# Patient Record
Sex: Male | Born: 1960 | Hispanic: No | Marital: Single | Smoking: Former smoker
Health system: Southern US, Community
[De-identification: ages and names within clinical notes are randomized; demographics above are authoritative.]

## PROBLEM LIST (undated history)

## (undated) DIAGNOSIS — G4733 Obstructive sleep apnea (adult) (pediatric): Secondary | ICD-10-CM

## (undated) DIAGNOSIS — I219 Acute myocardial infarction, unspecified: Secondary | ICD-10-CM

## (undated) DIAGNOSIS — I10 Essential (primary) hypertension: Secondary | ICD-10-CM

## (undated) DIAGNOSIS — C140 Malignant neoplasm of pharynx, unspecified: Secondary | ICD-10-CM

## (undated) DIAGNOSIS — I2510 Atherosclerotic heart disease of native coronary artery without angina pectoris: Secondary | ICD-10-CM

## (undated) DIAGNOSIS — J449 Chronic obstructive pulmonary disease, unspecified: Secondary | ICD-10-CM

## (undated) DIAGNOSIS — N200 Calculus of kidney: Secondary | ICD-10-CM

## (undated) DIAGNOSIS — E785 Hyperlipidemia, unspecified: Secondary | ICD-10-CM

## (undated) HISTORY — DX: Atherosclerotic heart disease of native coronary artery without angina pectoris: I25.10

## (undated) HISTORY — DX: Obstructive sleep apnea (adult) (pediatric): G47.33

## (undated) HISTORY — PX: CORONARY STENT PLACEMENT: SHX1402

## (undated) HISTORY — DX: Hyperlipidemia, unspecified: E78.5

## (undated) HISTORY — DX: Calculus of kidney: N20.0

## (undated) HISTORY — DX: Essential (primary) hypertension: I10

## (undated) HISTORY — DX: Malignant neoplasm of pharynx, unspecified: C14.0

## (undated) HISTORY — PX: THROAT SURGERY: SHX803

---

## 2009-03-11 ENCOUNTER — Inpatient Hospital Stay (HOSPITAL_COMMUNITY): Admission: EM | Admit: 2009-03-11 | Discharge: 2009-03-14 | Payer: Self-pay | Admitting: Cardiology

## 2009-03-11 ENCOUNTER — Encounter: Payer: Self-pay | Admitting: Cardiology

## 2009-03-11 ENCOUNTER — Ambulatory Visit: Payer: Self-pay | Admitting: Cardiology

## 2009-03-12 ENCOUNTER — Encounter: Payer: Self-pay | Admitting: Cardiology

## 2009-03-12 ENCOUNTER — Encounter: Payer: Self-pay | Admitting: Cardiovascular Disease

## 2009-03-13 ENCOUNTER — Encounter: Payer: Self-pay | Admitting: Cardiology

## 2009-03-31 DIAGNOSIS — I214 Non-ST elevation (NSTEMI) myocardial infarction: Secondary | ICD-10-CM | POA: Insufficient documentation

## 2009-03-31 DIAGNOSIS — R079 Chest pain, unspecified: Secondary | ICD-10-CM | POA: Insufficient documentation

## 2009-03-31 DIAGNOSIS — F17200 Nicotine dependence, unspecified, uncomplicated: Secondary | ICD-10-CM | POA: Insufficient documentation

## 2009-04-01 ENCOUNTER — Ambulatory Visit: Payer: Self-pay | Admitting: Cardiology

## 2009-04-01 DIAGNOSIS — E785 Hyperlipidemia, unspecified: Secondary | ICD-10-CM

## 2009-04-01 DIAGNOSIS — I2510 Atherosclerotic heart disease of native coronary artery without angina pectoris: Secondary | ICD-10-CM | POA: Insufficient documentation

## 2009-04-01 DIAGNOSIS — I10 Essential (primary) hypertension: Secondary | ICD-10-CM

## 2009-04-01 DIAGNOSIS — F1410 Cocaine abuse, uncomplicated: Secondary | ICD-10-CM

## 2009-04-15 ENCOUNTER — Encounter: Payer: Self-pay | Admitting: Cardiology

## 2009-04-17 ENCOUNTER — Telehealth (INDEPENDENT_AMBULATORY_CARE_PROVIDER_SITE_OTHER): Payer: Self-pay | Admitting: *Deleted

## 2009-04-18 ENCOUNTER — Telehealth (INDEPENDENT_AMBULATORY_CARE_PROVIDER_SITE_OTHER): Payer: Self-pay | Admitting: *Deleted

## 2009-05-16 ENCOUNTER — Encounter (INDEPENDENT_AMBULATORY_CARE_PROVIDER_SITE_OTHER): Payer: Self-pay | Admitting: *Deleted

## 2009-06-09 ENCOUNTER — Telehealth (INDEPENDENT_AMBULATORY_CARE_PROVIDER_SITE_OTHER): Payer: Self-pay | Admitting: *Deleted

## 2009-07-15 ENCOUNTER — Encounter (INDEPENDENT_AMBULATORY_CARE_PROVIDER_SITE_OTHER): Payer: Self-pay | Admitting: *Deleted

## 2010-03-31 NOTE — Progress Notes (Signed)
Summary: PHONE  Phone Note Call from Patient Call back at Home Phone [phone]   Caller: RENEE   SISTER Summary of Call: Called again wanting to know if we have heard anything about patient getting coupons or help with his medications.  please call [phone] Initial call taken by: Zachary George,  April 18, 2009 1:50 PM  Follow-up for Phone Call        see previous note for response. Follow-up by: Carlye Grippe,  April 18, 2009 2:45 PM

## 2010-03-31 NOTE — Letter (Signed)
Summary: Generic Engineer, agricultural at [facility] [address]   [place], [place] [address]   Phone: [phone]  Fax: [phone]        May 16, 2009 MRN: [id number]    Ronald Best [address] [place], [place]  [address]    Dear [name],  We have attempted to reach you several times regarding your recent lab results but have been unable to get in touch with you. Please, contact our office at your earliest convenience to receive these results.       Sincerely,  Cyril Loosen, RN, BSN  This letter has been electronically signed by your physician.

## 2010-03-31 NOTE — Cardiovascular Report (Signed)
Summary: Cardiac Cath Other  Cardiac Cath Other   Imported By: Zachary George 03/31/2009 13:51:28  _____________________________________________________________________  External Attachment:    Type:   Image     Comment:   External Document

## 2010-03-31 NOTE — Progress Notes (Signed)
Summary: REQUEST EFFIENT  Phone Note Call from Patient Call back at [phone]   Caller: Renae Siple sister in law Call For: nurse Summary of Call: request coupons for effient. Are these available here?  Initial call taken by: Carlye Grippe,  April 17, 2009 3:59 PM  Follow-up for Phone Call        called and informed Renae that she can go online and access coupons for medication. Follow-up by: Carlye Grippe,  April 18, 2009 2:45 PM

## 2010-03-31 NOTE — Letter (Signed)
Summary: Generic Engineer, agricultural at [facility] [address]   [place], [place] [address]   Phone: [phone]  Fax: [phone]        Jul 15, 2009 MRN: [id number]    Ronald Best [address] [place], [place]  [address]    Dear Mr. RAMNAUTH,   It appears you are now due for lab work to check your cholesterol and liver function following changes made to your medications in March and April. Please take the enclosed order to the [facility] at your earliest convenience to have this lab work drawn. Do not eat or drink after midnight.      Sincerely,  Cyril Loosen, RN, BSN  This letter has been electronically signed by your physician.

## 2010-03-31 NOTE — Assessment & Plan Note (Signed)
Summary: new pt.hospital d/c [facility] 1/14   Visit Type:  Follow-up Primary Provider:  DR.Vyas   History of Present Illness: 50 year old male admitted to [facility] in January of 2011 following a non-ST elevation myocardial infarction. The patient did have cocaine in his system. He underwent cardiac catheterization on January 12 of 2011. This revealed A. normal left main. The LAD had a 30% mid stenosis.  The distal LAD became small in caliber and had diffuse 40% stenosis.  The second obtuse marginal was a moderate-size vessel that had a diffuse area of 50% stenosis in the midportion.  The ostial circumflex vessel appeared to have a 20-30% stenosis. The right coronary artery had  100% occlusion in the midportion of the vessel.  The distal vessel was seen to fill via left-to-right collaterals. Ejection fraction of 35-40% with severe hypokinesis of the inferior wall. An echocardiogram on March 12, 2009 showed normal LV function. Note the patient did have a drug-eluting stent to the right coronary artery. Since discharge the patient denies any dyspnea on exertion, orthopnea, PND, pedal edema, palpitations, syncope or chest pain.    Current Medications (verified): 1)  Simvastatin 40 Mg Tabs (Simvastatin) .... Take 1 Tab Daily 2)  Effient 10 Mg Tabs (Prasugrel Hcl) .... Take 1 Tab Daily 3)  Plavix 75 Mg Tabs (Clopidogrel Bisulfate) .... Take 1 Tab Daily 4)  Nitrostat 0.4 Mg Subl (Nitroglycerin) 5)  Aspir-Low 81 Mg Tbec (Aspirin) .... Take 2 Tabs At Bedtime  Allergies (verified): No Known Drug Allergies  Past History:  Past Medical History: Throat CA Kidney Stone Hypertension, currently not on mediciation coronary artery disease  Family History: Reviewed history from 03/31/2009 and no changes required. Father had CABG recently age 9 Mother has diabetes Sister died at age 46 MI  Social History: Reviewed history from 03/31/2009 and no changes required. Smokes cigarettes greater  than 1 pack per day for many yrs He occasionally drinks alcohol Cocaine use was Jan. 7, 2011 Single Has one daughter  Review of Systems       Some arthralgias in the left knee but no fevers or chills, productive cough, hemoptysis, dysphasia, odynophagia, melena, hematochezia, dysuria, hematuria, rash, seizure activity, orthopnea, PND, pedal edema, claudication. Remaining systems are negative.   Vital Signs:  Patient profile:   50 year old male Height:      71 inches Weight:      297 pounds BMI:     41.57 Pulse rate:   81 / minute BP sitting:   152 / 87  (right arm)  Vitals Entered By: Dreama Saa, CNA (April 01, 2009 9:13 AM)  Physical Exam  General:  Well-developed obese in no acute distress.  Skin is warm and dry.  HEENT is normal.  Neck is supple. No thyromegaly.  Chest is clear to auscultation with normal expansion.  Cardiovascular exam is regular rate and rhythm.  Abdominal exam nontender or distended. No masses palpated. right groin shows no hematoma and no bruit. Extremities show no edema. neuro grossly intact    EKG  Procedure date:  04/01/2009  Findings:      Sinus rhythm at a rate of 74. Incomplete right bundle branch block. Inferior infarct with T-wave inversion.  Impression & Recommendations:  Problem # 1:  CAD (ICD-414.00) Continue aspirin, effient, and statin. I discussed the importance of diet and exercise. His updated medication list for this problem includes:    Effient 10 Mg Tabs (Prasugrel hcl) .Marland Kitchen... Take 1 tab daily  Nitrostat 0.4 Mg Subl (Nitroglycerin) .Marland Kitchen... Place on tablet under tongue every 5 minutes up to 3 doses as needed for chest pain. no more than 3 doses over a 15 minute period.    Aspir-low 81 Mg Tbec (Aspirin) .Marland Kitchen... Take 2 tabs at bedtime    Amlodipine Besylate 5 Mg Tabs (Amlodipine besylate) .Marland Kitchen... Take one tablet by mouth daily  Problem # 2:  HYPERLIPIDEMIA (ICD-272.4) Continue statin. Check lipids and liver in 2  weeks. His updated medication list for this problem includes:    Simvastatin 40 Mg Tabs (Simvastatin) .Marland Kitchen... Take 1 tab daily  His updated medication list for this problem includes:    Simvastatin 40 Mg Tabs (Simvastatin) .Marland Kitchen... Take 1 tab daily  Problem # 3:  ESSENTIAL HYPERTENSION, BENIGN (ICD-401.1) Blood pressure elevated. Add Norvasc  5 mg p.o. daily. I will avoid beta-blockade at this point given her history of cocaine use. His updated medication list for this problem includes:    Aspir-low 81 Mg Tbec (Aspirin) .Marland Kitchen... Take 2 tabs at bedtime    Amlodipine Besylate 5 Mg Tabs (Amlodipine besylate) .Marland Kitchen... Take one tablet by mouth daily  His updated medication list for this problem includes:    Aspir-low 81 Mg Tbec (Aspirin) .Marland Kitchen... Take 2 tabs at bedtime    Amlodipine Besylate 5 Mg Tabs (Amlodipine besylate) .Marland Kitchen... Take one tablet by mouth daily  Problem # 4:  COCAINE ABUSE (ICD-305.60) Discussed the importance of avoiding.  Problem # 5:  NONDEPENDENT TOBACCO USE DISORDER (ICD-305.1) Patient has discontinued his tobacco use.  Other Orders: Future Orders: T-Hepatic Function [id number]) ... 04/14/2009 T-Lipid Profile [id number]) ... 04/14/2009  Patient Instructions: 1)  Start Norvasc (amlodipine) 5 mg by mouth once daily.  2)  Your physician recommends that you go to the [facility] for a FASTING lipid profile and liver function labs:  in 2 weeks. Do not eat or drink after midnight.  3)  Your physician wants you to follow-up in: 6 months. You will receive a reminder letter in the mail one-two months in advance. If you don't receive a letter, please call our office to schedule the follow-up appointment. Prescriptions: NITROSTAT 0.4 MG SUBL (NITROGLYCERIN) Place on tablet under tongue every 5 minutes up to 3 doses as needed for chest pain. No more than 3 doses over a 15 minute period.  #25 x 3   Entered by:   Cyril Loosen, RN, BSN   Authorized by:   Ferman Hamming, MD,  [facility]   Signed by:   Cyril Loosen, RN, BSN on 04/01/2009   Method used:   Electronically to        [facility] (retail)       [address]       [place], [place]  [address]       Ph: [phone]       Fax: [phone]   RxID:   [id number]   Handout requested. AMLODIPINE BESYLATE 5 MG TABS (AMLODIPINE BESYLATE) Take one tablet by mouth daily  #30 x 6   Entered by:   Cyril Loosen, RN, BSN   Authorized by:   Ferman Hamming, MD, [facility]   Signed by:   Cyril Loosen, RN, BSN on 04/01/2009   Method used:   Electronically to        [facility] (retail)       [address] [employer]  [place], [place]  [address]       Ph: [phone]       Fax: [phone]   RxID:   [id number]   Handout requested.

## 2010-03-31 NOTE — Progress Notes (Signed)
Summary: Cannot afford Crestor  Phone Note Call from Patient Call back at [phone]   Summary of Call: Pt's sister-in-law, Luster Landsberg, called regarding Crestor. Pt's simvastatin was recently stopped and he was started on Crestor. She states she and her husband help her brother-in-law get his medications. She states the Crestor is going to be $142, and they cannot afford this. Is there something else he can take? Initial call taken by: Cyril Loosen, RN, BSN,  June 09, 2009 5:14 PM  Follow-up for Phone Call        He can take zocor 40 mg by mouth daily but should understand he will not get to goal with this. If he changes, check lipids and liver six weeks later Ferman Hamming, MD, [facility]  June 10, 2009 9:53 AM   Additional Follow-up for Phone Call Additional follow up Details #1::        Pt's sister-in-law notified. Will call into [facility] in [place] and recheck labs in 6 weeks. Additional Follow-up by: Cyril Loosen, RN, BSN,  June 10, 2009 10:32 AM    New/Updated Medications: SIMVASTATIN 40 MG TABS (SIMVASTATIN) Take 1 tab by mouth at bedtime Prescriptions: SIMVASTATIN 40 MG TABS (SIMVASTATIN) Take 1 tab by mouth at bedtime  #90 x 3   Entered by:   Cyril Loosen, RN, BSN   Authorized by:   Ferman Hamming, MD, [facility]   Signed by:   Cyril Loosen, RN, BSN on 06/10/2009   Method used:   Electronically to        [facility] [phone]* (retail)       [address]       [place], [place]  [address]       Ph: [phone] or [phone]       Fax: [phone]   RxID:   [id number]

## 2010-05-17 LAB — CBC
Hemoglobin: 13.4 g/dL (ref 13.0–17.0)
MCHC: 34.8 g/dL (ref 30.0–36.0)
MCV: 92.6 fL (ref 78.0–100.0)
Platelets: 153 10*3/uL (ref 150–400)
RBC: 4.01 MIL/uL — ABNORMAL LOW (ref 4.22–5.81)
RBC: 4.19 MIL/uL — ABNORMAL LOW (ref 4.22–5.81)
RDW: 15 % (ref 11.5–15.5)
WBC: 8.7 10*3/uL (ref 4.0–10.5)

## 2010-05-17 LAB — COMPREHENSIVE METABOLIC PANEL
ALT: 27 U/L (ref 0–53)
AST: 98 U/L — ABNORMAL HIGH (ref 0–37)
Alkaline Phosphatase: 63 U/L (ref 39–117)
CO2: 26 mEq/L (ref 19–32)
Calcium: 8.6 mg/dL (ref 8.4–10.5)
GFR calc Af Amer: >60 mL/min (ref >60)
GFR calc non Af Amer: >60 mL/min (ref >60)
Glucose, Bld: 111 mg/dL — ABNORMAL HIGH (ref 70–99)
Potassium: 3.7 mEq/L (ref 3.5–5.1)
Sodium: 139 mEq/L (ref 135–145)

## 2010-05-17 LAB — BASIC METABOLIC PANEL
BUN: 12 mg/dL (ref 6–23)
BUN: 12 mg/dL (ref 6–23)
CO2: 27 mEq/L (ref 19–32)
Calcium: 8.8 mg/dL (ref 8.4–10.5)
Chloride: 103 mEq/L (ref 96–112)
Chloride: 106 mEq/L (ref 96–112)
Creatinine, Ser: 1.02 mg/dL (ref 0.4–1.5)
GFR calc non Af Amer: >60 mL/min (ref >60)
Glucose, Bld: 93 mg/dL (ref 70–99)
Glucose, Bld: 98 mg/dL (ref 70–99)
Potassium: 3.9 mEq/L (ref 3.5–5.1)
Sodium: 137 mEq/L (ref 135–145)

## 2010-05-17 LAB — LIPID PANEL
Cholesterol: 167 mg/dL (ref 0–200)
LDL Cholesterol: 107 mg/dL — ABNORMAL HIGH (ref 0–99)
Triglycerides: 154 mg/dL — ABNORMAL HIGH (ref <150)

## 2010-05-17 LAB — HEMOGLOBIN A1C: Hgb A1c MFr Bld: 5.6 % (ref 4.6–6.1)

## 2010-05-17 LAB — CARDIAC PANEL(CRET KIN+CKTOT+MB+TROPI)
Relative Index: 11 — ABNORMAL HIGH (ref 0.0–2.5)
Total CK: 1176 U/L — ABNORMAL HIGH (ref 7–232)
Total CK: 1259 U/L — ABNORMAL HIGH (ref 7–232)
Troponin I: 39.18 ng/mL (ref 0.00–0.06)
Troponin I: 45.31 ng/mL (ref 0.00–0.06)

## 2010-05-17 LAB — TSH: TSH: 1.268 u[IU]/mL (ref 0.350–4.500)

## 2010-05-17 LAB — HEPARIN LEVEL (UNFRACTIONATED): Heparin Unfractionated: <0.1 IU/mL — ABNORMAL LOW (ref 0.30–0.70)

## 2010-05-17 LAB — PROTIME-INR: INR: 1.06 (ref 0.00–1.49)

## 2013-10-17 ENCOUNTER — Emergency Department (HOSPITAL_COMMUNITY)
Admission: EM | Admit: 2013-10-17 | Discharge: 2013-10-17 | Disposition: A | Discharge status: Home / Self Care | Payer: Self-pay | Attending: Emergency Medicine | Admitting: Emergency Medicine

## 2013-10-17 ENCOUNTER — Encounter (HOSPITAL_COMMUNITY): Payer: Self-pay | Admitting: Emergency Medicine

## 2013-10-17 ENCOUNTER — Emergency Department (HOSPITAL_COMMUNITY): Payer: Self-pay

## 2013-10-17 DIAGNOSIS — F17200 Nicotine dependence, unspecified, uncomplicated: Secondary | ICD-10-CM | POA: Insufficient documentation

## 2013-10-17 DIAGNOSIS — S59919A Unspecified injury of unspecified forearm, initial encounter: Secondary | ICD-10-CM

## 2013-10-17 DIAGNOSIS — S59909A Unspecified injury of unspecified elbow, initial encounter: Secondary | ICD-10-CM | POA: Insufficient documentation

## 2013-10-17 DIAGNOSIS — W06XXXA Fall from bed, initial encounter: Secondary | ICD-10-CM | POA: Insufficient documentation

## 2013-10-17 DIAGNOSIS — I10 Essential (primary) hypertension: Secondary | ICD-10-CM | POA: Insufficient documentation

## 2013-10-17 DIAGNOSIS — S5002XA Contusion of left elbow, initial encounter: Secondary | ICD-10-CM

## 2013-10-17 DIAGNOSIS — I2510 Atherosclerotic heart disease of native coronary artery without angina pectoris: Secondary | ICD-10-CM | POA: Insufficient documentation

## 2013-10-17 DIAGNOSIS — S5000XA Contusion of unspecified elbow, initial encounter: Secondary | ICD-10-CM | POA: Insufficient documentation

## 2013-10-17 DIAGNOSIS — Y9289 Other specified places as the place of occurrence of the external cause: Secondary | ICD-10-CM | POA: Insufficient documentation

## 2013-10-17 DIAGNOSIS — Z87442 Personal history of urinary calculi: Secondary | ICD-10-CM | POA: Insufficient documentation

## 2013-10-17 DIAGNOSIS — Z85819 Personal history of malignant neoplasm of unspecified site of lip, oral cavity, and pharynx: Secondary | ICD-10-CM | POA: Insufficient documentation

## 2013-10-17 DIAGNOSIS — Z791 Long term (current) use of non-steroidal anti-inflammatories (NSAID): Secondary | ICD-10-CM | POA: Insufficient documentation

## 2013-10-17 DIAGNOSIS — S6990XA Unspecified injury of unspecified wrist, hand and finger(s), initial encounter: Secondary | ICD-10-CM

## 2013-10-17 DIAGNOSIS — Y9389 Activity, other specified: Secondary | ICD-10-CM | POA: Insufficient documentation

## 2013-10-17 MED ORDER — HYDROCODONE-ACETAMINOPHEN 5-325 MG PO TABS: ORAL_TABLET | ORAL | Status: DC

## 2013-10-17 MED ORDER — IBUPROFEN 800 MG PO TABS
800.0000 mg | ORAL_TABLET | Freq: Once | ORAL | Status: AC
  Administered 2013-10-17: 800 mg via ORAL
  Filled 2013-10-17: qty 1

## 2013-10-17 MED ORDER — OXYCODONE-ACETAMINOPHEN 5-325 MG PO TABS
1.0000 | ORAL_TABLET | Freq: Once | ORAL | Status: AC
Start: 2013-10-17 — End: 2013-10-17
  Administered 2013-10-17: 1 via ORAL
  Filled 2013-10-17: qty 1

## 2013-10-17 MED ORDER — NAPROXEN 500 MG PO TABS: 500.0000 mg | ORAL_TABLET | Freq: Two times a day (BID) | ORAL | Status: DC

## 2013-10-17 NOTE — ED Notes (Signed)
Left elbow pain since falling out of bed night before last, while attempting to keep grandchild from falling out of bed.

## 2013-10-17 NOTE — Discharge Instructions (Signed)
Elbow Contusion ° An elbow contusion is a deep bruise of the elbow. Contusions happen when an injury causes bleeding under the skin. Signs of bruising include pain, puffiness (swelling), and discolored skin. The contusion may turn blue, purple, or yellow. °HOME CARE °· Put ice on the injured area. °¨ Put ice in a plastic bag. °¨ Place a towel between your skin and the bag. °¨ Leave the ice on for 15-20 minutes, 03-04 times a day. °· Only take medicines as told by your doctor. °· Rest your elbow until the pain and puffiness are better. °· Raise (elevate) your elbow to lessen puffiness. °· Put on an elastic wrap as told by your doctor. You can take it off for sleeping, showers, and baths. If your fingers get cold, blue, or lose feeling (numb), take the wrap off. Put the wrap back on more loosely. °· Use your elbow only as told by your doctor. If you are asked to do elbow exercises, do them as told. °· Keep all doctor visits as told. °GET HELP RIGHT AWAY IF: °· You have more redness, puffiness, or pain in your elbow. °· Your puffiness or pain is not helped by medicines. °· You have puffiness of the hand and fingers. °· You are not able to move your fingers or wrist. °· You start to lose feeling in your hand or fingers. °· Your fingers or hand become cold or blue. °MAKE SURE YOU:  °· Understand these instructions. °· Will watch your condition. °· Will get help right away if you are not doing well or get worse. °Document Released: 02/04/2011 Document Revised: 08/17/2011 Document Reviewed: 02/04/2011 °ExitCare® Patient Information ©2015 [employer]. This information is not intended to replace advice given to you by your health care provider. Make sure you discuss any questions you have with your health care provider. ° °

## 2013-10-20 NOTE — ED Provider Notes (Signed)
CSN: [id number]     Arrival date & time 10/17/13  1214 History   First MD Initiated Contact with Patient 10/17/13 1246     Chief Complaint  Patient presents with  . Elbow Pain   Ronald Best is a 53 y.o. male who presents to the Emergency Department complaining of left posterior elbow pain after a direct blow to the elbow after a fall.  Pt states that he accidentally fell out of bed, landing on his left arm.   reprots pain with full extension of the left arm.  He denies neck pain, numbness or weakness or the arm, neck pain , head injury or LOC.  He has used icy hot w/o relief.    (Consider location/radiation/quality/duration/timing/severity/associated sxs/prior Treatment) The history is provided by the patient.    Past Medical History  Diagnosis Date  . Throat cancer   . Kidney stones   . Hypertension   . Coronary artery disease    Past Surgical History  Procedure Laterality Date  . Throat surgery     Family History  Problem Relation Age of Onset  . Diabetes Mother    History  Substance Use Topics  . Smoking status: Current Every Day Smoker -- 1 years    Types: Cigarettes  . Smokeless tobacco: Not on file  . Alcohol Use: Yes     Comment: Occ    Review of Systems  Constitutional: Negative for fever and chills.  Respiratory: Negative for chest tightness.   Cardiovascular: Negative for chest pain.  Genitourinary: Negative for dysuria and difficulty urinating.  Musculoskeletal: Positive for arthralgias. Negative for back pain, joint swelling, neck pain and neck stiffness.  Skin: Negative for color change and wound.  Neurological: Negative for dizziness, weakness, numbness and headaches.  All other systems reviewed and are negative.     Allergies  Review of patient's allergies indicates not on file.  Home Medications   Prior to Admission medications   Medication Sig Start Date End Date Taking? Authorizing Provider  nitroGLYCERIN (NITROSTAT) 0.4 MG SL tablet Place  0.4 mg under the tongue every 5 (five) minutes as needed.   Yes Historical Provider, MD  HYDROcodone-acetaminophen (NORCO/VICODIN) 5-325 MG per tablet Take one-two tabs po q 4-6 hrs prn pain 10/17/13   Vaun Hyndman L. Johnaton Sonneborn, PA-C  naproxen (NAPROSYN) 500 MG tablet Take 1 tablet (500 mg total) by mouth 2 (two) times daily. Take with food 10/17/13   Seana Underwood L. Chauntae Hults, PA-C   BP 145/70  Pulse 94  Temp(Src) 98.1 F (36.7 C) (Oral)  Ht 5\' 11"  (1.803 m)  Wt 310 lb (140.615 kg)  BMI 43.26 kg/m2  SpO2 98% Physical Exam  Nursing note and vitals reviewed. Constitutional: He is oriented to person, place, and time. He appears well-developed and well-nourished. No distress.  HENT:  Head: Normocephalic and atraumatic.  Neck: Normal range of motion. Neck supple. No thyromegaly present.  Cardiovascular: Normal rate, regular rhythm, normal heart sounds and intact distal pulses.   No murmur heard. Pulmonary/Chest: Effort normal and breath sounds normal. No respiratory distress. He exhibits no tenderness.  Musculoskeletal: He exhibits tenderness. He exhibits no edema.  ttp of the posterior left elbow.  Pain reproduced with full extension, FROM.  Radial pulse is brisk, distal sensation intact, CR< 2 sec. Grip strength is strong and symmetrical.   No abrasions, edema , erythema or step-off deformity of the joint.   Lymphadenopathy:    He has no cervical adenopathy.  Neurological: He is alert and oriented to  person, place, and time. He has normal strength. No sensory deficit. He exhibits normal muscle tone. Coordination normal.  Skin: Skin is warm and dry.    ED Course  Procedures (including critical care time) Labs Review Labs Reviewed - No data to display  Imaging Review Dg Elbow Complete Left  10/17/2013   CLINICAL DATA:  Elbow pain post fall 3 days ago  EXAM: LEFT ELBOW - COMPLETE 3+ VIEW  COMPARISON:  None.  FINDINGS: Four views of left elbow submitted. No acute fracture or subluxation. Spurring of  proximal ulna and radial head. Mild spurring of lateral humeral epicondyle.  IMPRESSION: No acute fracture or subluxation.  Mild osteoarthritic changes.   Electronically Signed   By: Lahoma Crocker M.D.   On: 10/17/2013 12:44    EKG Interpretation None      MDM   Final diagnoses:  Elbow contusion, left, initial encounter    Pt with likely contusion to the left elbow.  XR neg for fx.  No erythema , edema or concerning sx's for septic joint.  NV intact.  Pt agrees to symptomatic tx with ice, vicodin and naprosyn.  Referral to orthopedics.  Pt agrees to plan and is stable for d/c    Haldon Carley L. Vanessa [place], PA-C 10/20/13 0012

## 2013-10-22 NOTE — ED Provider Notes (Signed)
Medical screening examination/treatment/procedure(s) were performed by non-physician practitioner and as supervising physician I was immediately available for consultation/collaboration.   EKG Interpretation None        Merryl Hacker, MD 10/22/13 [phone]

## 2014-01-06 ENCOUNTER — Encounter (HOSPITAL_COMMUNITY): Payer: Self-pay | Admitting: Emergency Medicine

## 2014-01-06 ENCOUNTER — Emergency Department (HOSPITAL_COMMUNITY)
Admission: EM | Admit: 2014-01-06 | Discharge: 2014-01-06 | Disposition: A | Discharge status: Home / Self Care | Payer: Self-pay | Attending: Emergency Medicine | Admitting: Emergency Medicine

## 2014-01-06 ENCOUNTER — Emergency Department (HOSPITAL_COMMUNITY): Payer: Self-pay

## 2014-01-06 DIAGNOSIS — S59902A Unspecified injury of left elbow, initial encounter: Secondary | ICD-10-CM | POA: Insufficient documentation

## 2014-01-06 DIAGNOSIS — Y998 Other external cause status: Secondary | ICD-10-CM | POA: Insufficient documentation

## 2014-01-06 DIAGNOSIS — I2510 Atherosclerotic heart disease of native coronary artery without angina pectoris: Secondary | ICD-10-CM | POA: Insufficient documentation

## 2014-01-06 DIAGNOSIS — Z792 Long term (current) use of antibiotics: Secondary | ICD-10-CM | POA: Insufficient documentation

## 2014-01-06 DIAGNOSIS — W19XXXA Unspecified fall, initial encounter: Secondary | ICD-10-CM

## 2014-01-06 DIAGNOSIS — Z85818 Personal history of malignant neoplasm of other sites of lip, oral cavity, and pharynx: Secondary | ICD-10-CM | POA: Insufficient documentation

## 2014-01-06 DIAGNOSIS — Y9389 Activity, other specified: Secondary | ICD-10-CM | POA: Insufficient documentation

## 2014-01-06 DIAGNOSIS — Z720 Tobacco use: Secondary | ICD-10-CM | POA: Insufficient documentation

## 2014-01-06 DIAGNOSIS — Z87442 Personal history of urinary calculi: Secondary | ICD-10-CM | POA: Insufficient documentation

## 2014-01-06 DIAGNOSIS — Y9289 Other specified places as the place of occurrence of the external cause: Secondary | ICD-10-CM | POA: Insufficient documentation

## 2014-01-06 DIAGNOSIS — W06XXXA Fall from bed, initial encounter: Secondary | ICD-10-CM | POA: Insufficient documentation

## 2014-01-06 DIAGNOSIS — I10 Essential (primary) hypertension: Secondary | ICD-10-CM | POA: Insufficient documentation

## 2014-01-06 DIAGNOSIS — M25522 Pain in left elbow: Secondary | ICD-10-CM

## 2014-01-06 DIAGNOSIS — Z79899 Other long term (current) drug therapy: Secondary | ICD-10-CM | POA: Insufficient documentation

## 2014-01-06 DIAGNOSIS — M7022 Olecranon bursitis, left elbow: Secondary | ICD-10-CM | POA: Insufficient documentation

## 2014-01-06 MED ORDER — OXYCODONE-ACETAMINOPHEN 5-325 MG PO TABS: 1.0000 | ORAL_TABLET | ORAL | Status: DC | PRN

## 2014-01-06 MED ORDER — NAPROXEN 500 MG PO TABS: 500.0000 mg | ORAL_TABLET | Freq: Two times a day (BID) | ORAL | Status: DC

## 2014-01-06 MED ORDER — OXYCODONE-ACETAMINOPHEN 5-325 MG PO TABS
1.0000 | ORAL_TABLET | Freq: Once | ORAL | Status: AC
  Administered 2014-01-06: 1 via ORAL
  Filled 2014-01-06: qty 1

## 2014-01-06 NOTE — ED Notes (Signed)
Pt reports was seen for arm injury after fall 2 weeks ago. Pt reports was told "elbow was cracked." pt reports continued pain ever since hit arm on Monday. Pt reports was seen at [facility] on Monday with no  New diagnosis. Pt reports swelling in LUE for last several days. nad noted. Radial pulses strong. Delayed cap refill.

## 2014-01-06 NOTE — Discharge Instructions (Signed)
Bursitis °Bursitis is when the fluid-filled sac (bursa) that covers and protects a joint gets puffy and irritated. The elbow, shoulder, hip, and knee joints are most often affected. °HOME CARE °· Put ice on the area. °¨ Put ice in a plastic bag. °¨ Place a towel between your skin and the bag. °¨ Leave the ice on for 15-20 minutes, 03-04 times a day. °· Put the joint through a full range of motion 4 times a day. Rest the injured joint at other times. When you have less pain, begin slow movements and usual activities. °· Only take medicine as told by your doctor. °· Follow up with your doctor. Any delay in care could stop the bursitis from healing. This could cause long-term pain. °GET HELP RIGHT AWAY IF:  °· You have more pain with treatment. °· You have a temperature by mouth above 102° F (38.9° C), not controlled by medicine. °· You have heat and irritation over the fluid-filled sac. °MAKE SURE YOU:  °· Understand these instructions. °· Will watch your condition. °· Will get help right away if you are not doing well or get worse. °Document Released: 08/05/2009 Document Revised: 05/10/2011 Document Reviewed: 05/07/2013 °ExitCare® Patient Information ©2015 [employer]. This information is not intended to replace advice given to you by your health care provider. Make sure you discuss any questions you have with your health care provider. ° °

## 2014-01-06 NOTE — Progress Notes (Signed)
ED/CM noted patient did not have health insurance and/or PCP listed in the computer.  Patient was given the [facility] resources handout with information on the clinics, food pantries, and the handout for new health insurance sign-up.  Also provided drug discount card.  Patient expressed appreciation for information received.

## 2014-01-06 NOTE — ED Provider Notes (Signed)
CSN: [id number]     Arrival date & time 01/06/14  1156 History  This chart was scribed for a non-physician practitioner, Kem Parkinson, PA-C, working with Tanna Furry, MD by Cathie Hoops, ED Scribe. The patient was seen in APFT23/APFT23. The patient's care was started at 1:42 PM.  Chief Complaint  Patient presents with  . Arm Injury   The history is provided by the patient and a relative. No language interpreter was used.   HPI Comments: Ronald Best is a 53 y.o. male who presents to the Emergency Department complaining of gradually worsening left arm pain onset 6 days ago. Pt localizes his pain to his left elbow. Pt has associated lower, left arm swelling and left hand swelling. Pt notes he previously fell out of the bed and fractured his left elbow in August 2015. Pt notes he re injured the left elbow after falling out of bed about 6 days ago. Pt went to [facility] and was told he has no fracture. Pt denies seeing an orthopedist for his injury due to lack of insurance. Pt denies pain radiating up his left arm, fever, chills, numbness or weakness of the arm, or chest pain. He attempted cold compresses and IcyHot without relief. He denies taking any pain medications to his symptoms.    Past Medical History  Diagnosis Date  . Throat cancer   . Kidney stones   . Hypertension   . Coronary artery disease    Past Surgical History  Procedure Laterality Date  . Throat surgery     Family History  Problem Relation Age of Onset  . Diabetes Mother    History  Substance Use Topics  . Smoking status: Current Every Day Smoker -- 1.00 packs/day for 1 years    Types: Cigarettes  . Smokeless tobacco: Not on file  . Alcohol Use: Yes     Comment: Occ    Review of Systems  Constitutional: Negative for fever and chills.  Gastrointestinal: Negative for nausea and vomiting.  Genitourinary: Negative for dysuria and difficulty urinating.  Musculoskeletal: Positive for joint swelling and arthralgias.      Left elbow pain and swelling  Skin: Negative for color change and wound.  All other systems reviewed and are negative.  Allergies  Review of patient's allergies indicates no known allergies.  Home Medications   Prior to Admission medications   Medication Sig Start Date End Date Taking? Authorizing Provider  cephALEXin (KEFLEX) 500 MG capsule Take 500 mg by mouth 2 (two) times daily. 01/01/14  Yes Historical Provider, MD  ibuprofen (ADVIL,MOTRIN) 400 MG tablet Take 800 mg by mouth every 6 (six) hours as needed. pain 01/01/14  Yes Historical Provider, MD  HYDROcodone-acetaminophen (NORCO/VICODIN) 5-325 MG per tablet Take one-two tabs po q 4-6 hrs prn pain Patient not taking: Reported on 01/06/2014 10/17/13   Latroya Ng L. Jese Comella, PA-C  naproxen (NAPROSYN) 500 MG tablet Take 1 tablet (500 mg total) by mouth 2 (two) times daily. Take with food Patient not taking: Reported on 01/06/2014 10/17/13   Timm Bonenberger L. Jannessa Ogden, PA-C   Triage Vitals: BP 164/65 mmHg  Pulse 84  Temp(Src) 97.8 F (36.6 C) (Oral)  Resp 20  Ht 5\' 11"  (1.803 m)  Wt 285 lb (129.275 kg)  BMI 39.77 kg/m2  SpO2 93%  Physical Exam  Constitutional: He is oriented to person, place, and time. He appears well-developed and well-nourished. No distress.  HENT:  Head: Normocephalic and atraumatic.  Eyes: Conjunctivae and EOM are normal.  Neck: Normal range  of motion. Neck supple. No tracheal deviation present.  Cardiovascular: Normal rate, regular rhythm, S1 normal, S2 normal, normal heart sounds and intact distal pulses.  Exam reveals no gallop and no friction rub.   No murmur heard. Pulmonary/Chest: Effort normal and breath sounds normal. No respiratory distress. He exhibits no tenderness.  Musculoskeletal: Normal range of motion. He exhibits edema and tenderness.  Diffuse, moderate soft tissue swelling of the left elbow and forearm. Radial pulse and distal sensation intact. Pain with ROM. No excessive erythema, compartments of the  left upper extremity are soft.  Neurological: He is alert and oriented to person, place, and time. He exhibits normal muscle tone. Coordination normal.  Skin: Skin is warm and dry.  Psychiatric: He has a normal mood and affect. His behavior is normal.  Nursing note and vitals reviewed.   ED Course  Procedures (including critical care time) DIAGNOSTIC STUDIES: Oxygen Saturation is 93% on RA, adequate by my interpretation.    COORDINATION OF CARE: 1:49 PM- Patient informed of current plan for treatment and evaluation and agrees with plan at this time.  Imaging Review Dg Elbow Complete Left  01/06/2014   CLINICAL DATA:  Pt c/o ulnar-sided LEFT elbow pain, swelling x 6 days s/p fell from bed at home, hitting ulnar surface of elbow against stone sculpture.  EXAM: LEFT ELBOW - COMPLETE 3+ VIEW  COMPARISON:  01/01/2014.  FINDINGS: Stable coronoid process spur formation. No fracture, dislocation or effusion.  IMPRESSION: No fracture.   Electronically Signed   By: Enrique Sack M.D.   On: 01/06/2014 14:17    MDM   Final diagnoses:  Fall  Elbow pain, left  Bursitis of elbow, left    Pt was seen by Dr. Jeneen Rinks and care plan discussed, clinical suspecion for septic joint is low.  NV intact.  Likely olecranon bursitis.  i have given pt info for social worker here at the hospital and advised him to speck with them regarding assistance in arranging orthopedic f/u.  Pt agrees to plan.  Appears stable for d/c.  Sling provided.  I personally performed the services described in this documentation, which was scribed in my presence. The recorded information has been reviewed and is accurate.    Georgianne Gritz L. Vanessa Hondah, PA-C 01/07/14 2101  Tanna Furry, MD 01/18/14 [phone]

## 2014-05-11 ENCOUNTER — Encounter (HOSPITAL_COMMUNITY): Payer: Self-pay | Admitting: *Deleted

## 2014-05-11 ENCOUNTER — Emergency Department (HOSPITAL_COMMUNITY): Payer: Self-pay

## 2014-05-11 ENCOUNTER — Emergency Department (HOSPITAL_COMMUNITY)
Admission: EM | Admit: 2014-05-11 | Discharge: 2014-05-11 | Disposition: A | Discharge status: Home / Self Care | Payer: Self-pay | Attending: Emergency Medicine | Admitting: Emergency Medicine

## 2014-05-11 DIAGNOSIS — I2510 Atherosclerotic heart disease of native coronary artery without angina pectoris: Secondary | ICD-10-CM | POA: Insufficient documentation

## 2014-05-11 DIAGNOSIS — Z87442 Personal history of urinary calculi: Secondary | ICD-10-CM | POA: Insufficient documentation

## 2014-05-11 DIAGNOSIS — M76899 Other specified enthesopathies of unspecified lower limb, excluding foot: Secondary | ICD-10-CM

## 2014-05-11 DIAGNOSIS — R52 Pain, unspecified: Secondary | ICD-10-CM

## 2014-05-11 DIAGNOSIS — Z720 Tobacco use: Secondary | ICD-10-CM | POA: Insufficient documentation

## 2014-05-11 DIAGNOSIS — Z85818 Personal history of malignant neoplasm of other sites of lip, oral cavity, and pharynx: Secondary | ICD-10-CM | POA: Insufficient documentation

## 2014-05-11 DIAGNOSIS — I10 Essential (primary) hypertension: Secondary | ICD-10-CM | POA: Insufficient documentation

## 2014-05-11 DIAGNOSIS — Z87828 Personal history of other (healed) physical injury and trauma: Secondary | ICD-10-CM | POA: Insufficient documentation

## 2014-05-11 DIAGNOSIS — M779 Enthesopathy, unspecified: Secondary | ICD-10-CM | POA: Insufficient documentation

## 2014-05-11 MED ORDER — NAPROXEN 500 MG PO TABS: 500.0000 mg | ORAL_TABLET | Freq: Two times a day (BID) | ORAL | Status: DC | PRN

## 2014-05-11 MED ORDER — ORPHENADRINE CITRATE ER 100 MG PO TB12: 100.0000 mg | ORAL_TABLET | Freq: Two times a day (BID) | ORAL | Status: DC | PRN

## 2014-05-11 MED ORDER — KETOROLAC TROMETHAMINE 60 MG/2ML IM SOLN
60.0000 mg | Freq: Once | INTRAMUSCULAR | Status: AC
  Administered 2014-05-11: 60 mg via INTRAMUSCULAR
  Filled 2014-05-11: qty 2

## 2014-05-11 MED ORDER — HYDROCODONE-ACETAMINOPHEN 5-325 MG PO TABS
2.0000 | ORAL_TABLET | Freq: Once | ORAL | Status: AC
  Administered 2014-05-11: 2 via ORAL
  Filled 2014-05-11: qty 2
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
## 2014-05-11 NOTE — Discharge Instructions (Signed)
If you were given medicines take as directed.  If you are on coumadin or contraceptives realize their levels and effectiveness is altered by many different medicines.  If you have any reaction (rash, tongues swelling, other) to the medicines stop taking and see a physician.   Please follow up as directed and return to the ER or see a physician for new or worsening symptoms.  Thank you. Filed Vitals:   05/11/14 1546 05/11/14 1750  BP: 150/129 149/72  Pulse: 94 90  Temp: 98.7 F (37.1 C) 97.8 F (36.6 C)  TempSrc: Oral   Resp: 18 20  Height: 5\' 11"  (1.803 m)   Weight: 310 lb (140.615 kg)   SpO2: 96% 94%
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
## 2015-06-13 ENCOUNTER — Emergency Department (HOSPITAL_COMMUNITY)
Admission: EM | Admit: 2015-06-13 | Discharge: 2015-06-13 | Disposition: A | Discharge status: Home / Self Care | Payer: Self-pay | Attending: Emergency Medicine | Admitting: Emergency Medicine

## 2015-06-13 ENCOUNTER — Encounter (HOSPITAL_COMMUNITY): Payer: Self-pay | Admitting: Emergency Medicine

## 2015-06-13 ENCOUNTER — Emergency Department (HOSPITAL_COMMUNITY): Payer: Self-pay

## 2015-06-13 DIAGNOSIS — M25571 Pain in right ankle and joints of right foot: Secondary | ICD-10-CM | POA: Insufficient documentation

## 2015-06-13 DIAGNOSIS — I10 Essential (primary) hypertension: Secondary | ICD-10-CM | POA: Insufficient documentation

## 2015-06-13 DIAGNOSIS — Z87891 Personal history of nicotine dependence: Secondary | ICD-10-CM | POA: Insufficient documentation

## 2015-06-13 DIAGNOSIS — Z79899 Other long term (current) drug therapy: Secondary | ICD-10-CM | POA: Insufficient documentation

## 2015-06-13 MED ORDER — INDOMETHACIN 25 MG PO CAPS: 25.0000 mg | ORAL_CAPSULE | Freq: Three times a day (TID) | ORAL | Status: DC | PRN

## 2015-06-13 MED ORDER — COLCHICINE 0.6 MG PO TABS: 0.6000 mg | ORAL_TABLET | Freq: Two times a day (BID) | ORAL | Status: DC

## 2015-06-13 NOTE — Discharge Instructions (Signed)

## 2015-06-13 NOTE — ED Notes (Signed)
Pt reports RT ankle pain x 2 days. States that pain began after mowing lawn, but states it feels like it could be a gout flare up. Pt has hx of gout, but is not currently taking medication for it.

## 2015-06-13 NOTE — ED Provider Notes (Signed)
CSN: [id number]     Arrival date & time 06/13/15  1805 History   First MD Initiated Contact with Patient 06/13/15 1906     Chief Complaint  Patient presents with  . Ankle Pain     (Consider location/radiation/quality/duration/timing/severity/associated sxs/prior Treatment) HPI   55 year old male with history of gout presents with complaints of right ankle pain. Patient report for the past 2-3 days he has had persistent sharp throbbing pain to his right ankle worsening with movement. Pain is moderate in intensity, felt similar to prior gout that he has in the past. He admits that he has been eating a lot of hot dogs which she felt may have triggered his condition. He denies any specific injury. He denies having fever, calf pain, numbness or weakness, or rash. He felt that there is heat generating from his right ankle. No complaint of toe pain, knee pain or hip pain. He has been hopping around. He denies any specific treatment tried. He denies alcohol abuse and recently quit smoking approximately a year ago.  Past Medical History  Diagnosis Date  . Throat cancer ([place])   . Kidney stones   . Hypertension   . Coronary artery disease    Past Surgical History  Procedure Laterality Date  . Throat surgery     Family History  Problem Relation Age of Onset  . Diabetes Mother    Social History  Substance Use Topics  . Smoking status: Former Smoker -- 1.00 packs/day for 1 years    Types: Cigarettes  . Smokeless tobacco: None  . Alcohol Use: Yes     Comment: Occ    Review of Systems  Constitutional: Negative for fever.  Musculoskeletal: Positive for arthralgias.  Skin: Negative for rash and wound.  Neurological: Negative for numbness.      Allergies  Review of patient's allergies indicates no known allergies.  Home Medications   Prior to Admission medications   Medication Sig Start Date End Date Taking? Authorizing Provider  hydrochlorothiazide (HYDRODIURIL) 25 MG tablet Take  25 mg by mouth. 03/28/15  Yes Historical Provider, MD  Omega-3 Fatty Acids (FISH OIL PO) Take 1 capsule by mouth daily.   Yes Historical Provider, MD   BP 136/85 mmHg  Pulse 86  Temp(Src) 97.9 F (36.6 C) (Oral)  Resp 18  Ht 5\' 11"  (1.803 m)  Wt 140.615 kg  BMI 43.26 kg/m2  SpO2 93% Physical Exam  Constitutional: He appears well-developed and well-nourished. No distress.  Moderately obese Caucasian male laying in bed in no acute discomfort.  HENT:  Head: Atraumatic.  Eyes: Conjunctivae are normal.  Neck: Neck supple.  Musculoskeletal: He exhibits tenderness (Right ankle: Tenderness to lateral malleolus region on palpation without any overlying skin changes, crepitus, or deformity. Normal ankle dorsiflexion and plantar flexion. The status pedis pulse palpable with brisk cap refill.). He exhibits no edema.  Bilateral lower extremities without palpable cords, erythema, or edema noted. No calf tenderness. R knee and R hip Nontender.  Neurological: He is alert.  Skin: No rash noted.  Psychiatric: He has a normal mood and affect.  Nursing note and vitals reviewed.   ED Course  Procedures (including critical care time) Labs Review Labs Reviewed - No data to display  Imaging Review Dg Ankle Complete Right  06/13/2015  CLINICAL DATA:  Three-day history of pain laterally EXAM: RIGHT ANKLE - COMPLETE 3+ VIEW COMPARISON:  Right ankle MRI December 30, 2010 FINDINGS: Frontal, oblique, and lateral views were obtained. 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 There is no acute fracture or joint effusion. The ankle mortise appears intact. No appreciable joint space narrowing. There are spurs arising from the posterior and inferior calcaneus. 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 This finding was present on prior MR examination. No acute fracture or joint effusion. Ankle mortise grossly intact. There are calcaneal spurs. Electronically  Signed   By: Lowella Grip III M.D.   On: 06/13/2015 18:57   I have personally reviewed and evaluated these images and lab results as part of my medical decision-making.   EKG Interpretation None      MDM   Final diagnoses:  Right ankle pain    BP 136/85 mmHg  Pulse 86  Temp(Src) 97.9 F (36.6 C) (Oral)  Resp 18  Ht 5\' 11"  (1.803 m)  Wt 140.615 kg  BMI 43.26 kg/m2  SpO2 93%   7:42 PM Patient with atraumatic right ankle pain with history of gout. X-ray of his right ankle show evidence of osteochondritis dissecans which is similar to prior MR examination. No acute fractures or joint effusion. This pain may be related to gout. I encouraged patient to avoid eating red meat, drinking alcohol and modify his diet is as it can worsen his condition. I have low suspicion for septic joint. I also have low suspicion for DVT. I will provide symptomatic treatment and orthopedic referral as needed. Return precaution discussed.  Domenic Moras, PA-C 06/13/15 [place], MD 06/14/15 [phone]

## 2015-07-11 ENCOUNTER — Inpatient Hospital Stay (HOSPITAL_COMMUNITY)
Admission: EM | Admit: 2015-07-11 | Discharge: 2015-07-13 | DRG: 190 | Disposition: A | Discharge status: Home / Self Care | Payer: Self-pay | Attending: Internal Medicine | Admitting: Internal Medicine

## 2015-07-11 ENCOUNTER — Emergency Department (HOSPITAL_COMMUNITY): Payer: Self-pay

## 2015-07-11 ENCOUNTER — Encounter (HOSPITAL_COMMUNITY): Payer: Self-pay | Admitting: Emergency Medicine

## 2015-07-11 DIAGNOSIS — R609 Edema, unspecified: Secondary | ICD-10-CM

## 2015-07-11 DIAGNOSIS — Z87891 Personal history of nicotine dependence: Secondary | ICD-10-CM

## 2015-07-11 DIAGNOSIS — J189 Pneumonia, unspecified organism: Secondary | ICD-10-CM | POA: Diagnosis present

## 2015-07-11 DIAGNOSIS — I252 Old myocardial infarction: Secondary | ICD-10-CM

## 2015-07-11 DIAGNOSIS — Z87442 Personal history of urinary calculi: Secondary | ICD-10-CM

## 2015-07-11 DIAGNOSIS — R0602 Shortness of breath: Secondary | ICD-10-CM

## 2015-07-11 DIAGNOSIS — I10 Essential (primary) hypertension: Secondary | ICD-10-CM | POA: Diagnosis present

## 2015-07-11 DIAGNOSIS — Z6841 Body Mass Index (BMI) 40.0 and over, adult: Secondary | ICD-10-CM

## 2015-07-11 DIAGNOSIS — Z955 Presence of coronary angioplasty implant and graft: Secondary | ICD-10-CM

## 2015-07-11 DIAGNOSIS — J441 Chronic obstructive pulmonary disease with (acute) exacerbation: Secondary | ICD-10-CM | POA: Diagnosis present

## 2015-07-11 DIAGNOSIS — G4733 Obstructive sleep apnea (adult) (pediatric): Secondary | ICD-10-CM | POA: Diagnosis present

## 2015-07-11 DIAGNOSIS — E872 Acidosis: Secondary | ICD-10-CM | POA: Diagnosis present

## 2015-07-11 DIAGNOSIS — J440 Chronic obstructive pulmonary disease with acute lower respiratory infection: Principal | ICD-10-CM | POA: Diagnosis present

## 2015-07-11 DIAGNOSIS — I2510 Atherosclerotic heart disease of native coronary artery without angina pectoris: Secondary | ICD-10-CM | POA: Diagnosis present

## 2015-07-11 DIAGNOSIS — I878 Other specified disorders of veins: Secondary | ICD-10-CM | POA: Diagnosis present

## 2015-07-11 DIAGNOSIS — E8729 Other acidosis: Secondary | ICD-10-CM | POA: Diagnosis present

## 2015-07-11 DIAGNOSIS — J9601 Acute respiratory failure with hypoxia: Secondary | ICD-10-CM | POA: Diagnosis present

## 2015-07-11 DIAGNOSIS — J9602 Acute respiratory failure with hypercapnia: Secondary | ICD-10-CM | POA: Diagnosis present

## 2015-07-11 DIAGNOSIS — Z85819 Personal history of malignant neoplasm of unspecified site of lip, oral cavity, and pharynx: Secondary | ICD-10-CM

## 2015-07-11 HISTORY — DX: Acute myocardial infarction, unspecified: I21.9

## 2015-07-11 HISTORY — DX: Chronic obstructive pulmonary disease, unspecified: J44.9

## 2015-07-11 LAB — CBC WITH DIFFERENTIAL/PLATELET
Basophils Absolute: 0 10*3/uL (ref 0.0–0.1)
Basophils Relative: 1 %
EOS PCT: 3 %
Eosinophils Absolute: 0.2 10*3/uL (ref 0.0–0.7)
HCT: 46.5 % (ref 39.0–52.0)
Hemoglobin: 14.5 g/dL (ref 13.0–17.0)
LYMPHS ABS: 1.3 10*3/uL (ref 0.7–4.0)
LYMPHS PCT: 17 %
MCH: 29.8 pg (ref 26.0–34.0)
MCHC: 31.2 g/dL (ref 30.0–36.0)
MCV: 95.7 fL (ref 78.0–100.0)
MONO ABS: 0.4 10*3/uL (ref 0.1–1.0)
Monocytes Relative: 5 %
Neutro Abs: 6 10*3/uL (ref 1.7–7.7)
Neutrophils Relative %: 74 %
PLATELETS: 224 10*3/uL (ref 150–400)
RBC: 4.86 MIL/uL (ref 4.22–5.81)
RDW: 15.5 % (ref 11.5–15.5)
WBC: 8 10*3/uL (ref 4.0–10.5)

## 2015-07-11 LAB — BLOOD GAS, ARTERIAL
Acid-Base Excess: 8.5 mmol/L — ABNORMAL HIGH (ref 0.0–2.0)
BICARBONATE: 30.4 meq/L — AB (ref 20.0–24.0)
Drawn by: 221791
FIO2: 21
O2 Saturation: 91.4 %
PATIENT TEMPERATURE: 37
PO2 ART: 65.4 mmHg — AB (ref 80.0–100.0)
TCO2: 18 mmol/L (ref 0–100)
pCO2 arterial: 62.3 mmHg (ref 35.0–45.0)
pH, Arterial: 7.357 (ref 7.350–7.450)

## 2015-07-11 LAB — TROPONIN I: Troponin I: <0.03 ng/mL (ref <0.031)

## 2015-07-11 LAB — BASIC METABOLIC PANEL
Anion gap: 6 (ref 5–15)
BUN: 13 mg/dL (ref 6–20)
CALCIUM: 8.9 mg/dL (ref 8.9–10.3)
CO2: 39 mmol/L — ABNORMAL HIGH (ref 22–32)
Chloride: 93 mmol/L — ABNORMAL LOW (ref 101–111)
Creatinine, Ser: 1 mg/dL (ref 0.61–1.24)
GFR calc Af Amer: >60 mL/min (ref >60)
GLUCOSE: 138 mg/dL — AB (ref 65–99)
Potassium: 3.5 mmol/L (ref 3.5–5.1)
Sodium: 138 mmol/L (ref 135–145)

## 2015-07-11 LAB — BRAIN NATRIURETIC PEPTIDE: B Natriuretic Peptide: 39 pg/mL (ref 0.0–100.0)

## 2015-07-11 MED ORDER — ALBUTEROL (5 MG/ML) CONTINUOUS INHALATION SOLN
10.0000 mg/h | INHALATION_SOLUTION | Freq: Once | RESPIRATORY_TRACT | Status: AC
  Administered 2015-07-11: 10 mg/h via RESPIRATORY_TRACT
  Filled 2015-07-11: qty 20

## 2015-07-11 MED ORDER — ALBUTEROL SULFATE (2.5 MG/3ML) 0.083% IN NEBU: 2.5000 mg | INHALATION_SOLUTION | RESPIRATORY_TRACT | Status: DC | PRN

## 2015-07-11 MED ORDER — CEFTRIAXONE SODIUM 1 G IJ SOLR
1.0000 g | Freq: Once | INTRAMUSCULAR | Status: AC
  Administered 2015-07-11: 1 g via INTRAVENOUS
  Filled 2015-07-11: qty 10

## 2015-07-11 MED ORDER — ALBUTEROL SULFATE (2.5 MG/3ML) 0.083% IN NEBU
2.5000 mg | INHALATION_SOLUTION | Freq: Four times a day (QID) | RESPIRATORY_TRACT | Status: DC
  Administered 2015-07-11 – 2015-07-13 (×5): 2.5 mg via RESPIRATORY_TRACT
  Filled 2015-07-11 (×5): qty 3

## 2015-07-11 MED ORDER — DEXTROSE 5 % IV SOLN
500.0000 mg | Freq: Once | INTRAVENOUS | Status: AC
  Administered 2015-07-11: 500 mg via INTRAVENOUS
  Filled 2015-07-11: qty 500

## 2015-07-11 MED ORDER — DEXTROSE 5 % IV SOLN
500.0000 mg | INTRAVENOUS | Status: DC
  Administered 2015-07-12: 500 mg via INTRAVENOUS
  Filled 2015-07-11 (×2): qty 500

## 2015-07-11 MED ORDER — ALBUTEROL (5 MG/ML) CONTINUOUS INHALATION SOLN
5.0000 mg/h | INHALATION_SOLUTION | Freq: Once | RESPIRATORY_TRACT | Status: AC
  Administered 2015-07-11: 5 mg/h via RESPIRATORY_TRACT
  Filled 2015-07-11: qty 20

## 2015-07-11 MED ORDER — DEXTROSE 5 % IV SOLN
1.0000 g | INTRAVENOUS | Status: DC
  Administered 2015-07-12: 1 g via INTRAVENOUS
  Filled 2015-07-11 (×3): qty 10

## 2015-07-11 MED ORDER — MOMETASONE FURO-FORMOTEROL FUM 200-5 MCG/ACT IN AERO
2.0000 | INHALATION_SPRAY | Freq: Two times a day (BID) | RESPIRATORY_TRACT | Status: DC
  Administered 2015-07-12 – 2015-07-13 (×3): 2 via RESPIRATORY_TRACT
  Filled 2015-07-11: qty 8.8

## 2015-07-11 MED ORDER — METHYLPREDNISOLONE SODIUM SUCC 125 MG IJ SOLR
60.0000 mg | Freq: Two times a day (BID) | INTRAMUSCULAR | Status: DC
  Administered 2015-07-12: 60 mg via INTRAVENOUS
  Filled 2015-07-11: qty 2

## 2015-07-11 MED ORDER — ALBUTEROL SULFATE (2.5 MG/3ML) 0.083% IN NEBU
2.5000 mg | INHALATION_SOLUTION | Freq: Once | RESPIRATORY_TRACT | Status: AC
  Administered 2015-07-11: 2.5 mg via RESPIRATORY_TRACT
  Filled 2015-07-11: qty 3

## 2015-07-11 MED ORDER — HYDROCHLOROTHIAZIDE 25 MG PO TABS
25.0000 mg | ORAL_TABLET | Freq: Every day | ORAL | Status: DC
  Administered 2015-07-12 – 2015-07-13 (×2): 25 mg via ORAL
  Filled 2015-07-11 (×2): qty 1

## 2015-07-11 MED ORDER — ENOXAPARIN SODIUM 40 MG/0.4ML ~~LOC~~ SOLN
40.0000 mg | SUBCUTANEOUS | Status: DC
  Administered 2015-07-11 – 2015-07-12 (×2): 40 mg via SUBCUTANEOUS
  Filled 2015-07-11 (×2): qty 0.4

## 2015-07-11 MED ORDER — IPRATROPIUM-ALBUTEROL 0.5-2.5 (3) MG/3ML IN SOLN
3.0000 mL | Freq: Once | RESPIRATORY_TRACT | Status: AC
  Administered 2015-07-11: 3 mL via RESPIRATORY_TRACT
  Filled 2015-07-11: qty 3

## 2015-07-11 MED ORDER — METHYLPREDNISOLONE SODIUM SUCC 125 MG IJ SOLR
125.0000 mg | Freq: Once | INTRAMUSCULAR | Status: AC
  Administered 2015-07-11: 125 mg via INTRAVENOUS
  Filled 2015-07-11: qty 2

## 2015-07-11 NOTE — ED Provider Notes (Signed)
CSN: [id number]     Arrival date & time 07/11/15  [id number] History   First MD Initiated Contact with Patient 07/11/15 1008     Chief Complaint  Patient presents with  . Shortness of Breath     (Consider location/radiation/quality/duration/timing/severity/associated sxs/prior Treatment) The history is provided by the patient.   Ronald Best is a 55 y.o. male with a history of COPD and sleep apnea presenting with increased shortness of breath for the past 48 hours.  He was cleaning out an abandoned house 2 days ago which he states was very dirty, dusty with known mold when he almost immediately developed shortness of breath.  He left the house, improved and when he tried to return shortness of breath resumed.  He is currently out of his albuterol MDI, also states he ran out of his Advair Diskus approximately 1 month ago.  He denies fevers or chills.  He has had a cough which has been nonproductive.  Feels generally fatigued.  Had pneumonia 3-4 months ago, admitted to [facility] during this illness.  He states his symptoms resemble how he felt during that infection.  His had no medications prior to arrival.  He denies chest pain, but endorses tightness.  Shortness of breath is worsened with exertion, denies orthopnea.    Past Medical History  Diagnosis Date  . Throat cancer ([place])   . Kidney stones   . Hypertension   . Coronary artery disease   . MI (myocardial infarction) ([place])   . COPD (chronic obstructive pulmonary disease) [facility])    Past Surgical History  Procedure Laterality Date  . Throat surgery    . Coronary stent placement     Family History  Problem Relation Age of Onset  . Diabetes Mother    Social History  Substance Use Topics  . Smoking status: Former Smoker -- 1.00 packs/day for 1 years    Types: Cigarettes  . Smokeless tobacco: None  . Alcohol Use: No    Review of Systems  Constitutional: Negative for fever.  HENT: Negative for congestion and sore throat.   Eyes:  Negative.   Respiratory: Positive for chest tightness, shortness of breath and wheezing.   Cardiovascular: Negative for chest pain and palpitations.  Gastrointestinal: Negative for nausea and abdominal pain.  Genitourinary: Negative.   Musculoskeletal: Negative for joint swelling, arthralgias and neck pain.  Skin: Negative.   Neurological: Negative for dizziness, weakness, light-headedness, numbness and headaches.  Psychiatric/Behavioral: Negative.       Allergies  Review of patient's allergies indicates no known allergies.  Home Medications   Prior to Admission medications   Medication Sig Start Date End Date Taking? Authorizing Provider  albuterol (PROVENTIL HFA;VENTOLIN HFA) 108 (90 Base) MCG/ACT inhaler Inhale 2 puffs into the lungs every 4 (four) hours as needed for wheezing or shortness of breath.   Yes Historical Provider, MD  albuterol (PROVENTIL) (2.5 MG/3ML) 0.083% nebulizer solution Take 2.5 mg by nebulization every 6 (six) hours as needed for wheezing or shortness of breath.   Yes Historical Provider, MD  colchicine 0.6 MG tablet Take 1 tablet (0.6 mg total) by mouth 2 (two) times daily. Patient taking differently: Take 0.6 mg by mouth daily as needed (gout flare up).  06/13/15  Yes Domenic Moras, PA-C  Fluticasone-Salmeterol (ADVAIR) 250-50 MCG/DOSE AEPB Inhale 1 puff into the lungs 2 (two) times daily.   Yes Historical Provider, MD  hydrochlorothiazide (HYDRODIURIL) 25 MG tablet Take 25 mg by mouth. 03/28/15  Yes Historical Provider, MD  Omega-3 Fatty Acids (FISH OIL PO) Take 1 capsule by mouth daily.   Yes Historical Provider, MD   BP 125/82 mmHg  Pulse 92  Temp(Src) 98 F (36.7 C) (Oral)  Resp 22  Ht 5\' 11"  (1.803 m)  Wt 136.079 kg  BMI 41.86 kg/m2  SpO2 94% Physical Exam  Constitutional: He appears well-developed and well-nourished.  HENT:  Head: Normocephalic and atraumatic.  Eyes: Conjunctivae are normal.  Neck: Normal range of motion.  Cardiovascular: Normal  rate, regular rhythm, normal heart sounds and intact distal pulses.   Pulmonary/Chest: He has decreased breath sounds. He has wheezes in the right middle field.  Profound decreased breath sounds throughout.  Occasional expiratory wheeze right mid field.  Abdominal: Soft. Bowel sounds are normal. There is no tenderness.  Musculoskeletal: Normal range of motion.  Neurological: He is alert.  Skin: Skin is warm and dry.  Psychiatric: He has a normal mood and affect.  Nursing note and vitals reviewed.   ED Course  Procedures (including critical care time) Labs Review Labs Reviewed  BASIC METABOLIC PANEL - Abnormal; Notable for the following:    Chloride 93 (*)    CO2 39 (*)    Glucose, Bld 138 (*)    All other components within normal limits  CBC WITH DIFFERENTIAL/PLATELET  BRAIN NATRIURETIC PEPTIDE  TROPONIN I  BLOOD GAS, ARTERIAL    Imaging Review Dg Chest 2 View  07/11/2015  CLINICAL DATA:  Shortness of breath for 3 days EXAM: CHEST  2 VIEW COMPARISON:  08/27/2005 FINDINGS: The heart size and mediastinal contours are within normal limits. Both lungs are well aerated with increased density in the left retrocardiac region projecting left lower lobe on the lateral projection. The visualized skeletal structures are unremarkable. IMPRESSION: Left lower lobe infiltrate. Followup PA and lateral chest X-ray is recommended in 3-4 weeks following trial of antibiotic therapy to ensure resolution and exclude underlying malignancy. Electronically Signed   By: Inez Catalina M.D.   On: 07/11/2015 11:51   I have personally reviewed and evaluated these images and lab results as part of my medical decision-making.   EKG Interpretation None      MDM   Final diagnoses:  None     Medications  albuterol (PROVENTIL) (2.5 MG/3ML) 0.083% nebulizer solution 2.5 mg (2.5 mg Nebulization Given 07/11/15 1017)  ipratropium-albuterol (DUONEB) 0.5-2.5 (3) MG/3ML nebulizer solution 3 mL (3 mLs Nebulization  Given 07/11/15 1017)  albuterol (PROVENTIL,VENTOLIN) solution continuous neb (10 mg/hr Nebulization Given 07/11/15 1131)  methylPREDNISolone sodium succinate (SOLU-MEDROL) 125 mg/2 mL injection 125 mg (125 mg Intravenous Given 07/11/15 1120)  cefTRIAXone (ROCEPHIN) 1 g in dextrose 5 % 50 mL IVPB (0 g Intravenous Stopped 07/11/15 1249)  albuterol (PROVENTIL,VENTOLIN) solution continuous neb (5 mg/hr Nebulization Given 07/11/15 1533)    Pt was given multiple neb tx, solumedrol per IV,  Improvement in aeration, but still with moderate wheezing, sob but improved per pt report.  Ambulated with increased sob, but without desaturation.  desat's to upper 70's low 80's when asleep in exam room, wife at bedside asserting he needs his cpap machine when sleeping.  He would benefit from at least overnight observation.  Dr. Wyvonnia Dusky saw patient and helped formulate plan and felt patient would benefit from overnight obs admission.  Pt agrees with admission.  He was given rocephin 1 gram IV.  Zithromax also ordered.  Discussed with Dr. Nehemiah Settle who will see pt in ed.     Evalee Jefferson, PA-C 07/11/15 1642  Annie Main  Rancour, MD 07/11/15 1827

## 2015-07-11 NOTE — ED Notes (Addendum)
Pt ambulated independently around nurse's station x 1. Oxygen saturation 94%-97% on RA. Pt denies sob/cp. States some increased respiratory effort, RR-28. nad noted.

## 2015-07-11 NOTE — ED Notes (Addendum)
Pt reports was cleaning out a house with known mold, pt reports shortness of breath started at that time. Pt reports no improvement. Moderate dyspnea noted with exertion. Pt denies productive cough but reports chest tightness. Pt reports used nebulizer and inhaler at home with no relief. Pt reports 3-4 months ago was diagnosed with pneumonia.

## 2015-07-11 NOTE — H&P (Addendum)
History and Physical  Ronald Best [id number] DOB: 1961/02/21 DOA: 07/11/2015  Referring physician: Landis Martins, ED provider PCP: No PCP Per Patient  Outpatient Specialists: None  Chief Complaint: Shortness of breath  HPI: Ronald Best is a 55 y.o. male with a history of throat cancer, hypertension, CAD, COPD. Patient presents with 4 days of worsening dyspnea that is worse with exertion and improved with rest. Dyspnea started on Monday after walking into a closed house that had extensive mold. Patient began to have immediate shortness of breath that improved when he went outside for several minutes. Upon reentering the house, the symptoms returned, have not cleared. He's been worsening over the past few days has had coughing and mild wheezing. Emergency department, the patient has had Solu-Medrol and nebulizer treatments, which have been mildly helpful. The patient has been also taking his Advair at home, which is not helped much.   Review of Systems:   Pt denies any fevers, chills, nausea, vomiting, diarrhea, constipation, abdominal pain, orthopnea, palpitations, headache, vision changes, lightheadedness, dizziness, melena, rectal bleeding.  Review of systems are otherwise negative  Past Medical History  Diagnosis Date  . Throat cancer ([place])   . Kidney stones   . Hypertension   . Coronary artery disease   . MI (myocardial infarction) ([place])   . COPD (chronic obstructive pulmonary disease) [facility])    Past Surgical History  Procedure Laterality Date  . Throat surgery    . Coronary stent placement     Social History:  reports that he has quit smoking. His smoking use included Cigarettes. He has a 1 pack-year smoking history. He does not have any smokeless tobacco history on file. He reports that he does not drink alcohol or use illicit drugs. Patient lives at Home  No Known Allergies  Family History  Problem Relation Age of Onset  . Diabetes Mother       Prior to Admission  medications   Medication Sig Start Date End Date Taking? Authorizing Provider  albuterol (PROVENTIL HFA;VENTOLIN HFA) 108 (90 Base) MCG/ACT inhaler Inhale 2 puffs into the lungs every 4 (four) hours as needed for wheezing or shortness of breath.   Yes Historical Provider, MD  albuterol (PROVENTIL) (2.5 MG/3ML) 0.083% nebulizer solution Take 2.5 mg by nebulization every 6 (six) hours as needed for wheezing or shortness of breath.   Yes Historical Provider, MD  colchicine 0.6 MG tablet Take 1 tablet (0.6 mg total) by mouth 2 (two) times daily. Patient taking differently: Take 0.6 mg by mouth daily as needed (gout flare up).  06/13/15  Yes Domenic Moras, PA-C  Fluticasone-Salmeterol (ADVAIR) 250-50 MCG/DOSE AEPB Inhale 1 puff into the lungs 2 (two) times daily.   Yes Historical Provider, MD  hydrochlorothiazide (HYDRODIURIL) 25 MG tablet Take 25 mg by mouth. 03/28/15  Yes Historical Provider, MD  Omega-3 Fatty Acids (FISH OIL PO) Take 1 capsule by mouth daily.   Yes Historical Provider, MD    Physical Exam: BP 135/75 mmHg  Pulse 106  Temp(Src) 98.1 F (36.7 C) (Oral)  Resp 16  Ht 5\' 11"  (1.803 m)  Wt 136.079 kg (300 lb)  BMI 41.86 kg/m2  SpO2 95%  General: Middle-aged Caucasian male. Awake and alert and oriented x3. No acute cardiopulmonary distress.  HEENT: Normocephalic atraumatic.  Right and left ears normal in appearance.  Pupils equal, round, reactive to light. Extraocular muscles are intact. Sclerae anicteric and noninjected.  Moist mucosal membranes. No mucosal lesions.  Neck: Neck supple without lymphadenopathy.  No carotid bruits. No masses palpated.  Cardiovascular: Regular rate with normal S1-S2 sounds. No murmurs, rubs, gallops auscultated. No JVD.  Respiratory: Poor respiratory movement.  Lungs sounds are tight. No accessory muscle use. Abdomen: Soft, nontender, nondistended. Active bowel sounds. No masses or hepatosplenomegaly  Skin: No rashes, lesions, or ulcerations.  Dry, warm to  touch. 2+ dorsalis pedis and radial pulses. Musculoskeletal: No calf or leg pain. All major joints not erythematous nontender.  No upper or lower joint deformation.  Good ROM.  No contractures  Psychiatric: Intact judgment and insight. Pleasant and cooperative. Neurologic: No focal neurological deficits. Strength is 5/5 and symmetric in upper and lower extremities.  Cranial nerves II through XII are grossly intact.           Labs on Admission: I have personally reviewed following labs and imaging studies  CBC:  Recent Labs Lab 07/11/15 1005  WBC 8.0  NEUTROABS 6.0  HGB 14.5  HCT 46.5  MCV 95.7  PLT [id number]   Basic Metabolic Panel:  Recent Labs Lab 07/11/15 1005  NA 138  K 3.5  CL 93*  CO2 39*  GLUCOSE 138*  BUN 13  CREATININE 1.00  CALCIUM 8.9   GFR: Estimated Creatinine Clearance: 116.2 mL/min (by C-G formula based on Cr of 1). Liver Function Tests: No results for input(s): AST, ALT, ALKPHOS, BILITOT, PROT, ALBUMIN in the last 168 hours. No results for input(s): LIPASE, AMYLASE in the last 168 hours. No results for input(s): AMMONIA in the last 168 hours. Coagulation Profile: No results for input(s): INR, PROTIME in the last 168 hours. Cardiac Enzymes:  Recent Labs Lab 07/11/15 1005  TROPONINI <0.03   BNP (last 3 results) No results for input(s): PROBNP in the last 8760 hours. HbA1C: No results for input(s): HGBA1C in the last 72 hours. CBG: No results for input(s): GLUCAP in the last 168 hours. Lipid Profile: No results for input(s): CHOL, HDL, LDLCALC, TRIG, CHOLHDL, LDLDIRECT in the last 72 hours. Thyroid Function Tests: No results for input(s): TSH, T4TOTAL, FREET4, T3FREE, THYROIDAB in the last 72 hours. Anemia Panel: No results for input(s): VITAMINB12, FOLATE, FERRITIN, TIBC, IRON, RETICCTPCT in the last 72 hours. Urine analysis: No results found for: COLORURINE, APPEARANCEUR, LABSPEC, PHURINE, GLUCOSEU, HGBUR, BILIRUBINUR, KETONESUR, PROTEINUR,  UROBILINOGEN, NITRITE, LEUKOCYTESUR Sepsis Labs: @LABRCNTIP (procalcitonin:4,lacticidven:4) )No results found for this or any previous visit (from the past 240 hour(s)).   Radiological Exams on Admission: Dg Chest 2 View  07/11/2015  CLINICAL DATA:  Shortness of breath for 3 days EXAM: CHEST  2 VIEW COMPARISON:  08/27/2005 FINDINGS: The heart size and mediastinal contours are within normal limits. Both lungs are well aerated with increased density in the left retrocardiac region projecting left lower lobe on the lateral projection. The visualized skeletal structures are unremarkable. IMPRESSION: Left lower lobe infiltrate. Followup PA and lateral chest X-ray is recommended in 3-4 weeks following trial of antibiotic therapy to ensure resolution and exclude underlying malignancy. Electronically Signed   By: Inez Catalina M.D.   On: 07/11/2015 11:51    Assessment/Plan: Active Problems:   CAP (community acquired pneumonia)   Acute respiratory failure with hypoxia and hypercarbia ([place])    This patient was discussed with the ED physician, including pertinent vitals, physical exam findings, labs, and imaging.  We also discussed care given by the ED provider.  #1 acute respiratory failure with hypercarbia  Observation  2 L nasal cannula #2 community-acquired pneumonia  Urine strep antigen  Azithromycin and ceftriaxone  Sputum cultures  Blood cultures  Continue Solu-Medrol  Nebulizer treatments  May need CT of chest if minimal improvement #3 hypertension  Continue hydrochlorothiazide #4 respiratory acidosis with compensated metabolic alkalosis  Recheck BMP in the morning to look for improvement of metabolic alkalosis  DVT prophylaxis: Lovenox Consultants: None Code Status: Full code Family Communication: Wife in the room  Disposition Plan: Observation   Truett Mainland, DO [facility] Pager [phone]  If 7PM-7AM, please contact  night-coverage [email or web address] Password [email or web address]

## 2015-07-11 NOTE — Progress Notes (Signed)
Pt. Arrived to unit rm 335 in stable condition.  Report received from [facility] ED, RN.

## 2015-07-11 NOTE — ED Notes (Signed)
CRITICAL VALUE ALERT  Critical value received:  PH 7.357, CO2 62.3, PO2 65.4, HCO3 30.4, and SAT 91% on RA  Date of notification:  07/11/15  Time of notification:  [id number]  Critical value read back:Yes.    Nurse who received alert:  Allegra Lai, RN  MD notified (1st page):  Dr Sherwood Gambler  Time of first page:  1720

## 2015-07-11 NOTE — ED Notes (Signed)
Room air O2 saturation 86%-92% while sleeping on stretcher. Wife states patient wears CPAP at night. O2 saturation increases to 94-96% on room air when awake.

## 2015-07-12 ENCOUNTER — Observation Stay (HOSPITAL_COMMUNITY): Payer: Self-pay

## 2015-07-12 DIAGNOSIS — J441 Chronic obstructive pulmonary disease with (acute) exacerbation: Secondary | ICD-10-CM

## 2015-07-12 DIAGNOSIS — I10 Essential (primary) hypertension: Secondary | ICD-10-CM

## 2015-07-12 LAB — STREP PNEUMONIAE URINARY ANTIGEN: STREP PNEUMO URINARY ANTIGEN: NEGATIVE

## 2015-07-12 LAB — EXPECTORATED SPUTUM ASSESSMENT W REFEX TO RESP CULTURE: SPECIAL REQUESTS: NORMAL

## 2015-07-12 LAB — BASIC METABOLIC PANEL
ANION GAP: 8 (ref 5–15)
BUN: 23 mg/dL — AB (ref 6–20)
CALCIUM: 9.1 mg/dL (ref 8.9–10.3)
CO2: 36 mmol/L — AB (ref 22–32)
Chloride: 92 mmol/L — ABNORMAL LOW (ref 101–111)
Creatinine, Ser: 1.03 mg/dL (ref 0.61–1.24)
GFR calc Af Amer: >60 mL/min (ref >60)
GFR calc non Af Amer: >60 mL/min (ref >60)
GLUCOSE: 163 mg/dL — AB (ref 65–99)
Potassium: 3.7 mmol/L (ref 3.5–5.1)
SODIUM: 136 mmol/L (ref 135–145)

## 2015-07-12 LAB — EXPECTORATED SPUTUM ASSESSMENT W GRAM STAIN, RFLX TO RESP C

## 2015-07-12 MED ORDER — DEXTROSE 5 % IV SOLN
INTRAVENOUS | Status: AC
  Filled 2015-07-12: qty 500

## 2015-07-12 MED ORDER — ASPIRIN 81 MG PO CHEW
81.0000 mg | CHEWABLE_TABLET | Freq: Every day | ORAL | Status: DC
  Administered 2015-07-12 – 2015-07-13 (×2): 81 mg via ORAL
  Filled 2015-07-12 (×2): qty 1

## 2015-07-12 MED ORDER — METHYLPREDNISOLONE SODIUM SUCC 40 MG IJ SOLR
40.0000 mg | Freq: Two times a day (BID) | INTRAMUSCULAR | Status: DC
Start: 2015-07-12 — End: 2015-07-13
  Administered 2015-07-12 – 2015-07-13 (×2): 40 mg via INTRAVENOUS
  Filled 2015-07-12 (×2): qty 1

## 2015-07-12 NOTE — Progress Notes (Signed)
PROGRESS NOTE        PATIENT DETAILS Name: Ronald Best Age: 55 y.o. Sex: male Date of Birth: [birth year] Admit Date: 07/11/2015 Admitting Physician Truett Mainland, DO PCP:No PCP Per Patient Outpatient Specialists: None  Brief Narrative: Patient is a 55 y.o. male throat cancer, COPD, OSA on CPAP prior history of CAD status post remote PCI,  presented with shortness of breath and cough. Found to have left lower lobe pneumonia and COPD exacerbation.  Subjective: Feels much better. Shortness of breath as significantly improved. No fever. Cough is rapidly improving.  Assessment/Plan: Active Problems: CAP (community acquired pneumonia): Has significantly improved, no leukocytosis or fever. Continue Rocephin and Zithromax. Blood cultures, urinary antigens currently pending and will need to be followed. Suspect needs one additional day of hospitalization, if clinically improvement continues he should be able to go home on 5/14.  COPD exacerbation: Much improved-comfortable sitting at bedside-lungs are clear with only a few scattered rhonchi. Taper Solu-Medrol, continue bronchodilators and empiric antibiotics.  Chronic hypercarbic respiratory failure: Likely secondary to OSA/COPD. Resume CPAP daily at bedtime.  History of CAD: No chest pain. Initial troponins negative, EKG without any acute abnormalities on admission. Resume aspirin.  Hypertension: Controlled with HCTZ.  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 Will check Dopplers.  Morbid obesity: Counseled regarding importance of weight loss  DVT Prophylaxis: Prophylactic Lovenox  Code Status: Full code   Family Communication: None at bedside  Disposition Plan: Remain inpatient-suspect home on 5/14 if clinically improvement continues  Antimicrobial agents: IV Rocephin 5/12>> IV Zithromax 5/12>>  Procedures: Lower  extremity Dopplers pending  CONSULTS:  None  Time spent: 25 minutes-Greater than 50% of this time was spent in counseling, explanation of diagnosis, planning of further management, and coordination of care.  MEDICATIONS: Anti-infectives    Start     Dose/Rate Route Frequency Ordered Stop   07/12/15 1600  azithromycin (ZITHROMAX) 500 mg in dextrose 5 % 250 mL IVPB     500 mg 250 mL/hr over 60 Minutes Intravenous Every 24 hours 07/11/15 1825 07/19/15 1559   07/12/15 1200  cefTRIAXone (ROCEPHIN) 1 g in dextrose 5 % 50 mL IVPB     1 g 100 mL/hr over 30 Minutes Intravenous Every 24 hours 07/11/15 1825 07/19/15 1159   07/11/15 1630  azithromycin (ZITHROMAX) 500 mg in dextrose 5 % 250 mL IVPB     500 mg 250 mL/hr over 60 Minutes Intravenous  Once 07/11/15 1620 07/11/15 1751   07/11/15 1200  cefTRIAXone (ROCEPHIN) 1 g in dextrose 5 % 50 mL IVPB     1 g 100 mL/hr over 30 Minutes Intravenous  Once 07/11/15 1157 07/11/15 1249      Scheduled Meds: . albuterol  2.5 mg Nebulization Q6H  . azithromycin  500 mg Intravenous Q24H  . cefTRIAXone (ROCEPHIN)  IV  1 g Intravenous Q24H  . enoxaparin (LOVENOX) injection  40 mg Subcutaneous Q24H  . hydrochlorothiazide  25 mg Oral Daily  . methylPREDNISolone (SOLU-MEDROL) injection  40 mg Intravenous Q12H  . mometasone-formoterol  2 puff Inhalation BID   Continuous Infusions:  PRN Meds:.albuterol   PHYSICAL EXAM: Vital signs: Filed Vitals:   07/11/15 2056 07/11/15 2100 07/12/15 0600 07/12/15 0818  BP:  134/57 155/65   Pulse:  99 90   Temp:  97.8 F (36.6 C) 98.5 F (36.9 C)   TempSrc:  Oral    Resp:  17 18   Height:      Weight:      SpO2: 98% 97% 90% 96%   Filed Weights   07/11/15 1002 07/11/15 1821  Weight: 136.079 kg (300 lb) 140.615 kg (310 lb)   Body mass index is 43.26 kg/(m^2).   Gen Exam: Awake and alert with clear speech. Not in any distress  Neck: Supple, No JVD.   Chest: B/L Clear.   CVS: S1 S2 Regular, no murmurs.    Abdomen: soft, BS +, non tender, non distended. Extremities: no edema, lower extremities warm to touch. Neurologic: Non Focal.   Skin: No Rash or lesions   Wounds: N/A.    LABORATORY DATA: CBC:  Recent Labs Lab 07/11/15 1005  WBC 8.0  NEUTROABS 6.0  HGB 14.5  HCT 46.5  MCV 95.7  PLT [id number]    Basic Metabolic Panel:  Recent Labs Lab 07/11/15 1005 07/12/15 0635  NA 138 136  K 3.5 3.7  CL 93* 92*  CO2 39* 36*  GLUCOSE 138* 163*  BUN 13 23*  CREATININE 1.00 1.03  CALCIUM 8.9 9.1    GFR: Estimated Creatinine Clearance: 114.9 mL/min (by C-G formula based on Cr of 1.03).  Liver Function Tests: No results for input(s): AST, ALT, ALKPHOS, BILITOT, PROT, ALBUMIN in the last 168 hours. No results for input(s): LIPASE, AMYLASE in the last 168 hours. No results for input(s): AMMONIA in the last 168 hours.  Coagulation Profile: No results for input(s): INR, PROTIME in the last 168 hours.  Cardiac Enzymes:  Recent Labs Lab 07/11/15 1005  TROPONINI <0.03    BNP (last 3 results) No results for input(s): PROBNP in the last 8760 hours.  HbA1C: No results for input(s): HGBA1C in the last 72 hours.  CBG: No results for input(s): GLUCAP in the last 168 hours.  Lipid Profile: No results for input(s): CHOL, HDL, LDLCALC, TRIG, CHOLHDL, LDLDIRECT in the last 72 hours.  Thyroid Function Tests: No results for input(s): TSH, T4TOTAL, FREET4, T3FREE, THYROIDAB in the last 72 hours.  Anemia Panel: No results for input(s): VITAMINB12, FOLATE, FERRITIN, TIBC, IRON, RETICCTPCT in the last 72 hours.  Urine analysis: No results found for: COLORURINE, APPEARANCEUR, LABSPEC, PHURINE, GLUCOSEU, HGBUR, BILIRUBINUR, KETONESUR, PROTEINUR, UROBILINOGEN, NITRITE, LEUKOCYTESUR  Sepsis Labs: Lactic Acid, Venous No results found for: LATICACIDVEN  MICROBIOLOGY: Recent Results (from the past 240 hour(s))  Culture, blood (routine x 2) Call MD if unable to obtain prior to antibiotics  being given     Status: None (Preliminary result)   Collection Time: 07/11/15  7:00 PM  Result Value Ref Range Status   Specimen Description BLOOD LEFT HAND  Final   Special Requests BOTTLES DRAWN AEROBIC AND ANAEROBIC [place]  Final   Culture PENDING  Incomplete   Report Status PENDING  Incomplete  Culture, blood (routine x 2) Call MD if unable to obtain prior to antibiotics being given     Status: None (Preliminary result)   Collection Time: 07/11/15  7:04 PM  Result Value Ref Range Status   Specimen Description BLOOD LEFT HAND  Final   Special Requests BOTTLES DRAWN AEROBIC AND ANAEROBIC [place]  Final   Culture PENDING  Incomplete   Report Status PENDING  Incomplete    RADIOLOGY STUDIES/RESULTS: Dg Chest 2 View  07/11/2015  CLINICAL DATA:  Shortness of breath for 3 days EXAM: CHEST  2 VIEW  COMPARISON:  08/27/2005 FINDINGS: The heart size and mediastinal contours are within normal limits. Both lungs are well aerated with increased density in the left retrocardiac region projecting left lower lobe on the lateral projection. The visualized skeletal structures are unremarkable. IMPRESSION: Left lower lobe infiltrate. Followup PA and lateral chest X-ray is recommended in 3-4 weeks following trial of antibiotic therapy to ensure resolution and exclude underlying malignancy. Electronically Signed   By: Inez Catalina M.D.   On: 07/11/2015 11:51   Dg Ankle Complete Right  06/13/2015  CLINICAL DATA:  Three-day history of pain laterally EXAM: RIGHT ANKLE - COMPLETE 3+ VIEW COMPARISON:  Right ankle MRI December 30, 2010 FINDINGS: Frontal, oblique, and lateral views were obtained. 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 There is no acute fracture or joint effusion. The ankle mortise appears intact. No appreciable joint space narrowing. There are spurs arising from the posterior and inferior calcaneus. 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  This finding was present on prior MR examination. No acute fracture or joint effusion. Ankle mortise grossly intact. There are calcaneal spurs. Electronically Signed   By: Lowella Grip III M.D.   On: 06/13/2015 18:57       Oren Binet, MD  [facility] Pager:[phone]  If 7PM-7AM, please contact night-coverage [email or web address] Password [email or web address] 07/12/2015, 9:34 AM

## 2015-07-13 MED ORDER — METHYLPREDNISOLONE 4 MG PO TBPK: ORAL_TABLET | ORAL | Status: DC

## 2015-07-13 MED ORDER — CEFPODOXIME PROXETIL 200 MG PO TABS: 200.0000 mg | ORAL_TABLET | Freq: Two times a day (BID) | ORAL | Status: DC

## 2015-07-13 MED ORDER — ASPIRIN 81 MG PO CHEW: 81.0000 mg | CHEWABLE_TABLET | Freq: Every day | ORAL | Status: DC

## 2015-07-13 MED ORDER — AZITHROMYCIN 500 MG PO TABS: 500.0000 mg | ORAL_TABLET | Freq: Every day | ORAL | Status: DC

## 2015-07-13 MED ORDER — CARVEDILOL 3.125 MG PO TABS: 3.1250 mg | ORAL_TABLET | Freq: Two times a day (BID) | ORAL | Status: DC

## 2015-07-13 NOTE — Discharge Summary (Signed)
Ronald Best, is a 55 y.o. male  DOB Jul 07, 1960  MRN [id number].  Admission date:  07/11/2015  Admitting Physician  Truett Mainland, DO  Discharge Date:  07/13/2015   Primary MD  No PCP Per Patient  Recommendations for primary care physician for things to follow:   Check 2 view CXR, CBC and BMP in 7-10 days   Admission Diagnosis  SOB (shortness of breath) [R06.02] Community acquired pneumonia [J18.9]   Discharge Diagnosis  SOB (shortness of breath) [R06.02] Community acquired pneumonia [J18.9]     Active Problems:   CAP (community acquired pneumonia)   Acute respiratory failure with hypoxia and hypercarbia ([place])   Compensated respiratory acidosis      Past Medical History  Diagnosis Date  . Throat cancer ([place])   . Kidney stones   . Hypertension   . Coronary artery disease   . MI (myocardial infarction) ([place])   . COPD (chronic obstructive pulmonary disease) [facility])     Past Surgical History  Procedure Laterality Date  . Throat surgery    . Coronary stent placement         HPI  from the history and physical done on the day of admission:    Ronald Best is a 55 y.o. male with a history of throat cancer, hypertension, CAD, COPD. Patient presents with 4 days of worsening dyspnea that is worse with exertion and improved with rest. Dyspnea started on Monday after walking into a closed house that had extensive mold. Patient began to have immediate shortness of breath that improved when he went outside for several minutes. Upon reentering the house, the symptoms returned, have not cleared. He's been worsening over the past few days has had coughing and mild wheezing. Emergency department, the patient has had Solu-Medrol and nebulizer treatments, which have been mildly helpful. The patient has been also taking his  Advair at home, which is not helped much.     Hospital Course:   CAP with mild COPD Exacerbation - much improved and completely symptom-free, he feels back to baseline, was treated with IV Rocephin and azithromycin along with low-dose IV Solu-Medrol. No wheezing today. Will be placed on 5 more days of oral antibiotics along with Medrol Dosepak, requested to follow with local free clinic in 7-10 days and get a repeat CBC, BMP and a 2 view chest x-ray checked. One-time outpatient pulmonary follow-up can be considered at some point. Patient has quit smoking 1 year ago. Mild baseline COPD.  Morbid obesity with Obstructive sleep apnea. CPAP daily at bedtime. Follow with primary care physician of free clinic for weight loss.  CAD. No chest pain, EKG troponin unremarkable. Continue home medications and secondary risk factor modification. Placed n aspirin and Coreg.  Hypertension. On HCTZ, blood pressure still slightly high Coreg added.  Chronic venous stasis in the right lower extremity. No signs of infection. Ultrasound stable.   Follow UP  Follow-up Information    Schedule an appointment as soon as possible for a visit with [facility].   Why:  Please call Monday Morning ASAP for apptointment   Contact information:   [place]       Consults obtained - None  Discharge Condition: Stable  Diet and Activity recommendation: See Discharge Instructions below  Discharge Instructions           Discharge Instructions    Diet - low sodium heart healthy    Complete by:  As directed      Discharge instructions    Complete by:  As directed   Follow with Primary MD in 7 days   Get CBC, CMP, 2 view Chest X ray checked  by Primary MD next visit.    Activity: As tolerated with Full fall precautions use walker/cane & assistance as needed   Disposition Home     Diet:   Heart Healthy  .  For Heart failure  patients - Check your Weight same time everyday, if you gain over 2 pounds, or you develop in leg swelling, experience more shortness of breath or chest pain, call your Primary MD immediately. Follow Cardiac Low Salt Diet and 1.5 lit/day fluid restriction.   On your next visit with your primary care physician please Get Medicines reviewed and adjusted.   Please request your Prim.MD to go over all Hospital Tests and Procedure/Radiological results at the follow up, please get all Hospital records sent to your Prim MD by signing hospital release before you go home.   If you experience worsening of your admission symptoms, develop shortness of breath, life threatening emergency, suicidal or homicidal thoughts you must seek medical attention immediately by calling 911 or calling your MD immediately  if symptoms less severe.  You Must read complete instructions/literature along with all the possible adverse reactions/side effects for all the Medicines you take and that have been prescribed to you. Take any new Medicines after you have completely understood and accpet all the possible adverse reactions/side effects.   Do not drive, operating heavy machinery, perform activities at heights, swimming or participation in water activities or provide baby sitting services if your were admitted for syncope or siezures until you have seen by Primary MD or a Neurologist and advised to do so again.  Do not drive when taking Pain medications.    Do not take more than prescribed Pain, Sleep and Anxiety Medications  Special Instructions: If you have smoked or chewed Tobacco  in the last 2 yrs please stop smoking, stop any regular Alcohol  and or any Recreational drug use.  Wear Seat belts while driving.   Please note  You were cared for by a hospitalist during your hospital stay. If you have any questions about your discharge medications or the care you received while you were in the hospital after you are  discharged, you can call the unit and asked to speak with the hospitalist on call if the hospitalist that took care of you is not available. Once you are discharged, your primary care physician will handle any further medical issues. Please note that NO REFILLS for any discharge medications will be authorized once you are discharged, as it is imperative that you return to your primary care physician (or establish a relationship with a primary care physician if you do not have one) for your aftercare needs so that they can reassess your need for medications and monitor your lab values.     Increase activity slowly    Complete by:  As directed              Discharge Medications       Medication List    TAKE these medications        albuterol 108 (90 Base) MCG/ACT inhaler  Commonly known as:  PROVENTIL HFA;VENTOLIN HFA  Inhale 2 puffs into the lungs every 4 (four) hours as needed for wheezing or shortness of breath.     albuterol (2.5 MG/3ML) 0.083% nebulizer solution  Commonly known as:  PROVENTIL  Take 2.5 mg by nebulization every 6 (six) hours as needed for wheezing or shortness of breath.     aspirin 81 MG chewable tablet  Chew 1 tablet (81 mg total) by mouth daily.     azithromycin 500 MG tablet  Commonly known as:  ZITHROMAX  Take 1 tablet (500 mg total) by mouth daily.     carvedilol 3.125 MG tablet  Commonly known as:  COREG  Take 1 tablet (3.125 mg total) by mouth 2 (two) times daily with a meal.     cefpodoxime 200 MG tablet  Commonly known as:  VANTIN  Take 1 tablet (200 mg total) by mouth 2 (two) times daily.     colchicine 0.6 MG tablet  Take 1 tablet (0.6 mg total) by mouth 2 (two) times daily.     FISH OIL PO  Take 1 capsule by mouth daily.     Fluticasone-Salmeterol 250-50 MCG/DOSE Aepb  Commonly known as:  ADVAIR  Inhale 1 puff into the lungs 2 (two) times daily.     hydrochlorothiazide 25 MG tablet  Commonly known as:  HYDRODIURIL  Take 25 mg by  mouth.     methylPREDNISolone 4 MG Tbpk tablet  Commonly known as:  MEDROL DOSEPAK  follow package directions        Major procedures and Radiology Reports - PLEASE review detailed and final reports for all details, in brief -       Dg Chest 2 View  07/11/2015  CLINICAL DATA:  Shortness of breath for 3 days EXAM: CHEST  2 VIEW COMPARISON:  08/27/2005 FINDINGS: The heart size and mediastinal contours are within normal limits. Both lungs are well aerated with increased density in the left retrocardiac region projecting left lower lobe on the lateral projection. The visualized skeletal structures are unremarkable. IMPRESSION: Left lower lobe infiltrate. Followup PA and lateral chest X-ray is recommended in 3-4 weeks following trial of antibiotic therapy to ensure resolution and exclude underlying malignancy. Electronically Signed   By: Inez Catalina M.D.   On: 07/11/2015 11:51     US Venous Img Lower Bilateral  07/12/2015  CLINICAL DATA:  Bilateral lower extremity edema, right greater than left. Obesity. EXAM: BILATERAL LOWER EXTREMITY VENOUS DOPPLER ULTRASOUND TECHNIQUE: Gray-scale sonography with graded compression, as well as color Doppler and duplex ultrasound were performed to evaluate the lower extremity deep venous systems from the level of the common femoral vein and including the common femoral, femoral, profunda femoral, popliteal and calf veins including the posterior tibial, peroneal and gastrocnemius veins when visible. The superficial great saphenous vein was also interrogated. Spectral Doppler was utilized to evaluate flow at rest and with distal augmentation maneuvers in the common femoral, femoral and popliteal veins. COMPARISON:  None. FINDINGS: RIGHT LOWER EXTREMITY Common Femoral Vein: No evidence of thrombus. Normal compressibility, respiratory phasicity and response to augmentation. Saphenofemoral Junction: No evidence of thrombus. Normal compressibility and flow on color Doppler  imaging. Profunda Femoral Vein: No evidence of thrombus.  Normal compressibility and flow on color Doppler imaging. Femoral Vein: No evidence of thrombus. Normal compressibility, respiratory phasicity and response to augmentation. Popliteal Vein: No evidence of thrombus. Normal compressibility, respiratory phasicity and response to augmentation. Calf Veins: Limited visualization of the calf veins. Superficial Great Saphenous Vein: No evidence of thrombus. Normal compressibility and flow on color Doppler imaging. Venous Reflux:  None. Other Findings:  None. LEFT LOWER EXTREMITY Common Femoral Vein: No evidence of thrombus. Normal compressibility, respiratory phasicity and response to augmentation. Saphenofemoral Junction: No evidence of thrombus. Normal compressibility and flow on color Doppler imaging. Profunda Femoral Vein: No evidence of thrombus. Normal compressibility and flow on color Doppler imaging. Femoral Vein: No evidence of thrombus. Normal compressibility, respiratory phasicity and response to augmentation. Popliteal Vein: No evidence of thrombus. Normal compressibility, respiratory phasicity and response to augmentation. Calf Veins: Limited assessment of the calf veins. Superficial Great Saphenous Vein: No evidence of thrombus. Normal compressibility and flow on color Doppler imaging. Venous Reflux:  None. Other Findings:  None. IMPRESSION: No significant femoral popliteal occlusive DVT in either extremity. Electronically Signed   By: Jerilynn Mages.  Shick M.D.   On: 07/12/2015 11:42    Micro Results      Recent Results (from the past 240 hour(s))  Culture, blood (routine x 2) Call MD if unable to obtain prior to antibiotics being given     Status: None (Preliminary result)   Collection Time: 07/11/15  7:00 PM  Result Value Ref Range Status   Specimen Description BLOOD LEFT HAND  Final   Special Requests BOTTLES DRAWN AEROBIC AND ANAEROBIC 6CC EACH  Final   Culture NO GROWTH 2 DAYS  Final   Report  Status PENDING  Incomplete  Culture, blood (routine x 2) Call MD if unable to obtain prior to antibiotics being given     Status: None (Preliminary result)   Collection Time: 07/11/15  7:04 PM  Result Value Ref Range Status   Specimen Description BLOOD LEFT HAND  Final   Special Requests BOTTLES DRAWN AEROBIC AND ANAEROBIC 6CC EACH  Final   Culture NO GROWTH 2 DAYS  Final   Report Status PENDING  Incomplete  Culture, sputum-assessment     Status: None   Collection Time: 07/12/15  9:43 AM  Result Value Ref Range Status   Specimen Description SPUTUM  Final   Special Requests Normal  Final   Sputum evaluation   Final    THIS SPECIMEN IS ACCEPTABLE. RESPIRATORY CULTURE REPORT TO FOLLOW.   Report Status 07/12/2015 FINAL  Final       Today   Subjective    Ronald Best today has no headache,no chest abdominal pain,no new weakness tingling or numbness, feels much better wants to go home today.     Objective   Blood pressure 153/87, pulse 79, temperature 97.8 F (36.6 C), temperature source Oral, resp. rate 20, height 5\' 11"  (1.803 m), weight 140.615 kg (310 lb), SpO2 96 %.   Intake/Output Summary (Last 24 hours) at 07/13/15 0816 Last data filed at 07/12/15 1812  Gross per 24 hour  Intake   1200 ml  Output      0 ml  Net   1200 ml    Exam Awake Alert, Oriented x 3, No new F.N deficits, Normal affect .AT,PERRAL Supple Neck,No JVD, No cervical lymphadenopathy appriciated.  Symmetrical Chest wall movement, Good air movement bilaterally, CTAB RRR,No Gallops,Rubs or new Murmurs, No Parasternal Heave +ve B.Sounds, Abd Soft, Non tender, No organomegaly appriciated, No rebound -  guarding or rigidity. No Cyanosis, Clubbing or edema, No new Rash or bruise   Data Review   CBC w Diff:  Lab Results  Component Value Date   WBC 8.0 07/11/2015   HGB 14.5 07/11/2015   HCT 46.5 07/11/2015   PLT 224 07/11/2015   LYMPHOPCT 17 07/11/2015   MONOPCT 5 07/11/2015   EOSPCT 3 07/11/2015    BASOPCT 1 07/11/2015    CMP:  Lab Results  Component Value Date   NA 136 07/12/2015   K 3.7 07/12/2015   CL 92* 07/12/2015   CO2 36* 07/12/2015   BUN 23* 07/12/2015   CREATININE 1.03 07/12/2015   PROT 7.1 03/12/2009   ALBUMIN 3.6 03/12/2009   BILITOT 1.0 03/12/2009   ALKPHOS 63 03/12/2009   AST 98* 03/12/2009   ALT 27 03/12/2009  .   Total Time in preparing paper work, data evaluation and todays exam - 35 minutes  Thurnell Lose M.D on 07/13/2015 at [facility]  [phone]

## 2015-07-13 NOTE — Discharge Instructions (Signed)

## 2015-07-13 NOTE — Progress Notes (Signed)
Discharge instructions read to pt.  Pt verbalized understanding of all instructions. Discharged to home with family

## 2015-07-13 NOTE — Progress Notes (Signed)
Patient is asleep on CPAP auto running 10 cm h20 no oxygen.

## 2015-07-16 LAB — CULTURE, BLOOD (ROUTINE X 2)
Culture: NO GROWTH
Culture: NO GROWTH

## 2015-07-16 LAB — CULTURE, RESPIRATORY

## 2015-07-16 LAB — CULTURE, RESPIRATORY W GRAM STAIN

## 2015-09-01 IMAGING — CR DG ELBOW COMPLETE 3+V*L*
4 series · 4 of 4 positions shown · non-contrast
Comparison: None.

CLINICAL DATA: Elbow pain post fall 3 days ago

EXAM:
LEFT ELBOW - COMPLETE 3+ VIEW

[view not recorded (1 of 4)]
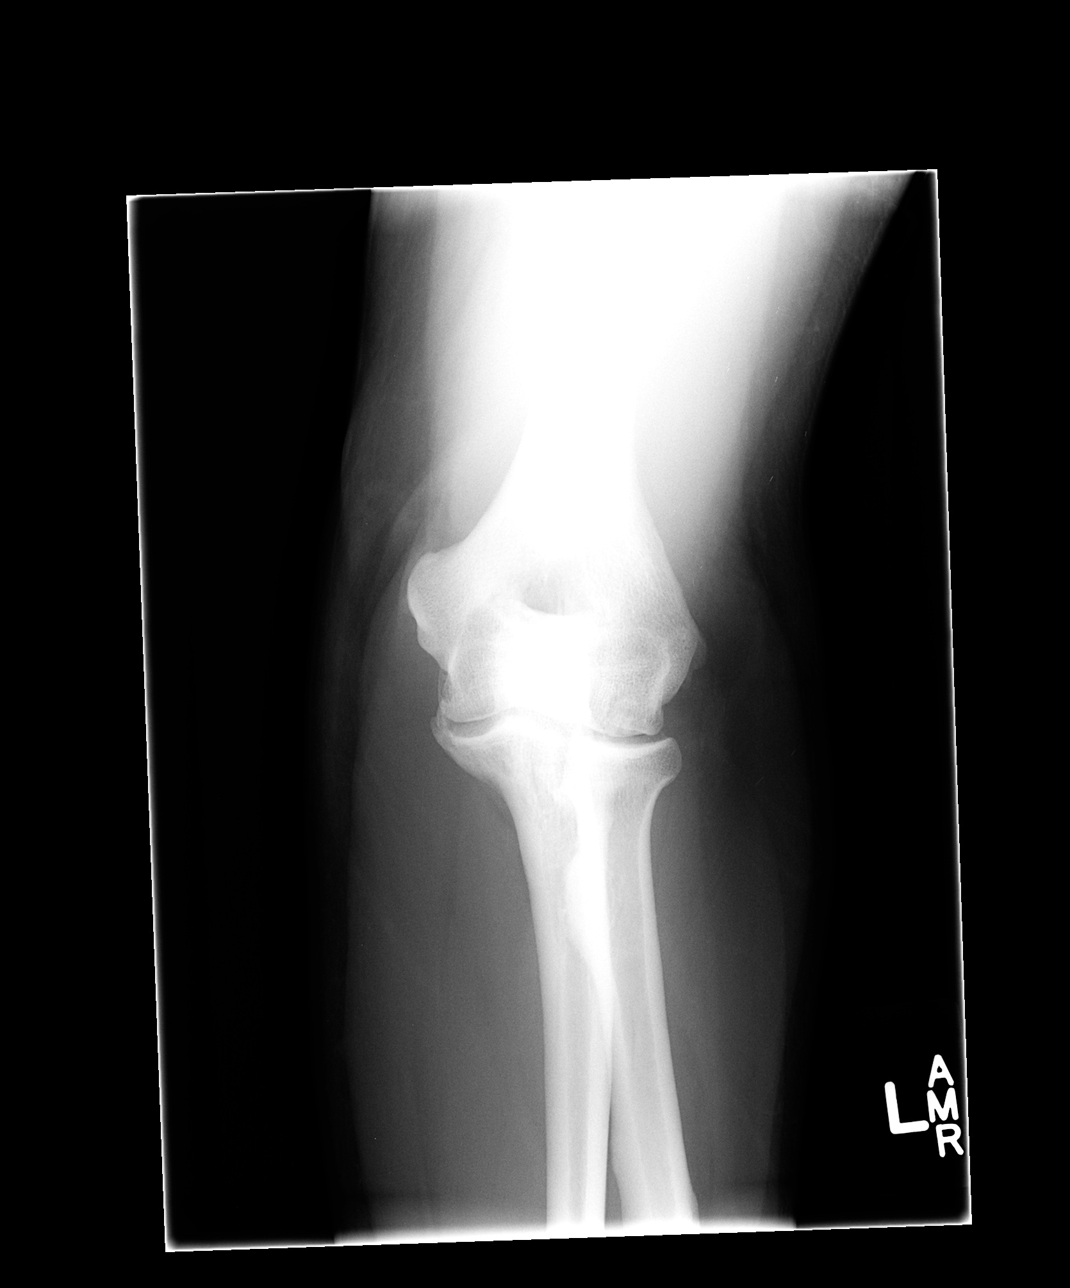

[view not recorded (2 of 4)]
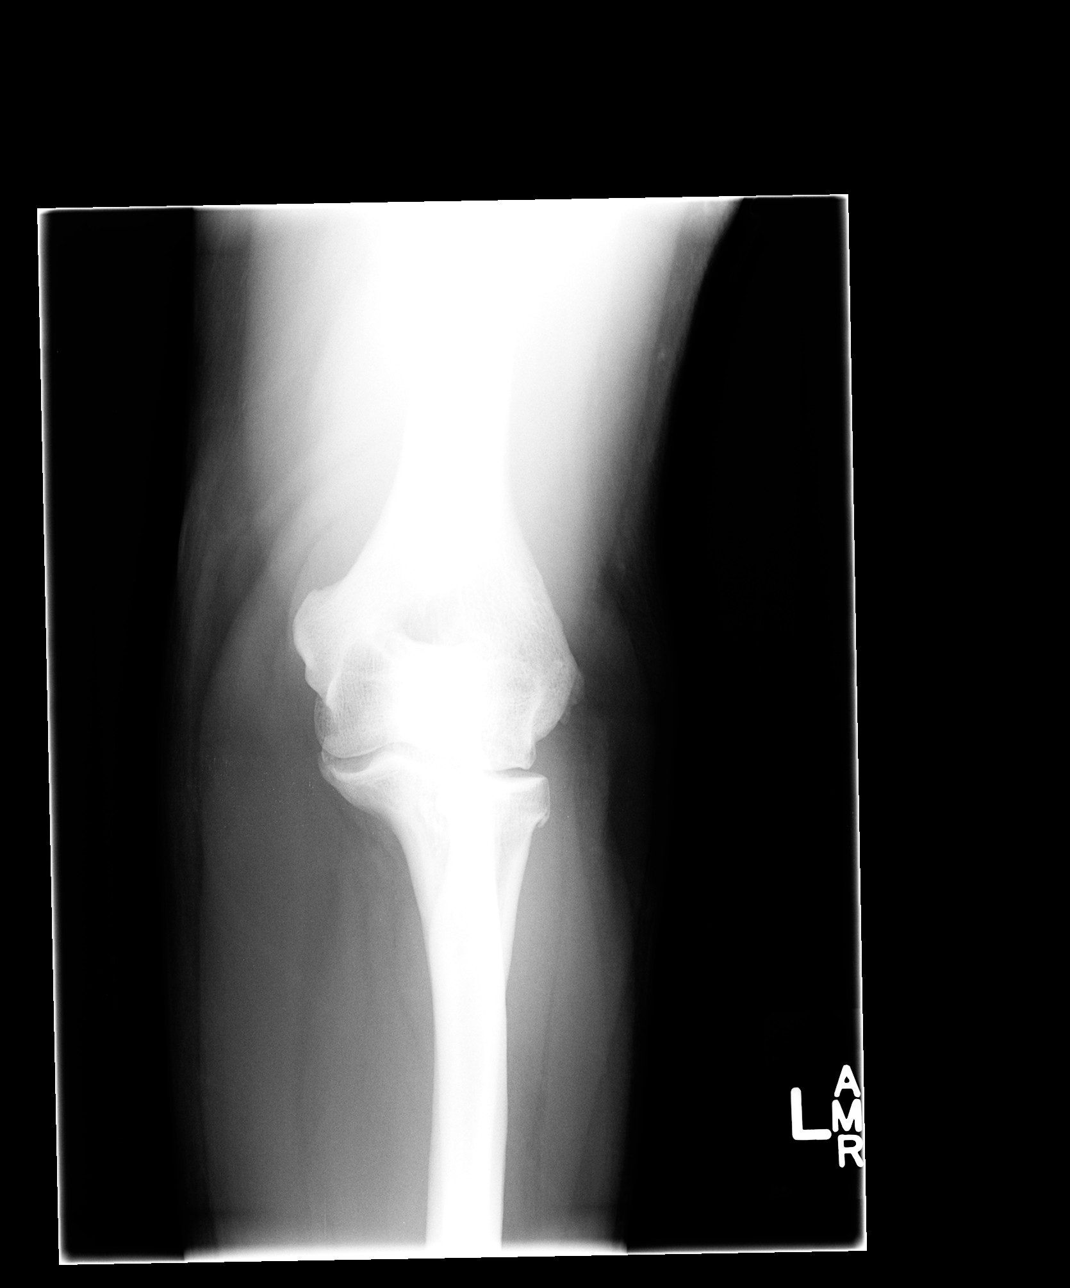

[view not recorded (3 of 4)]
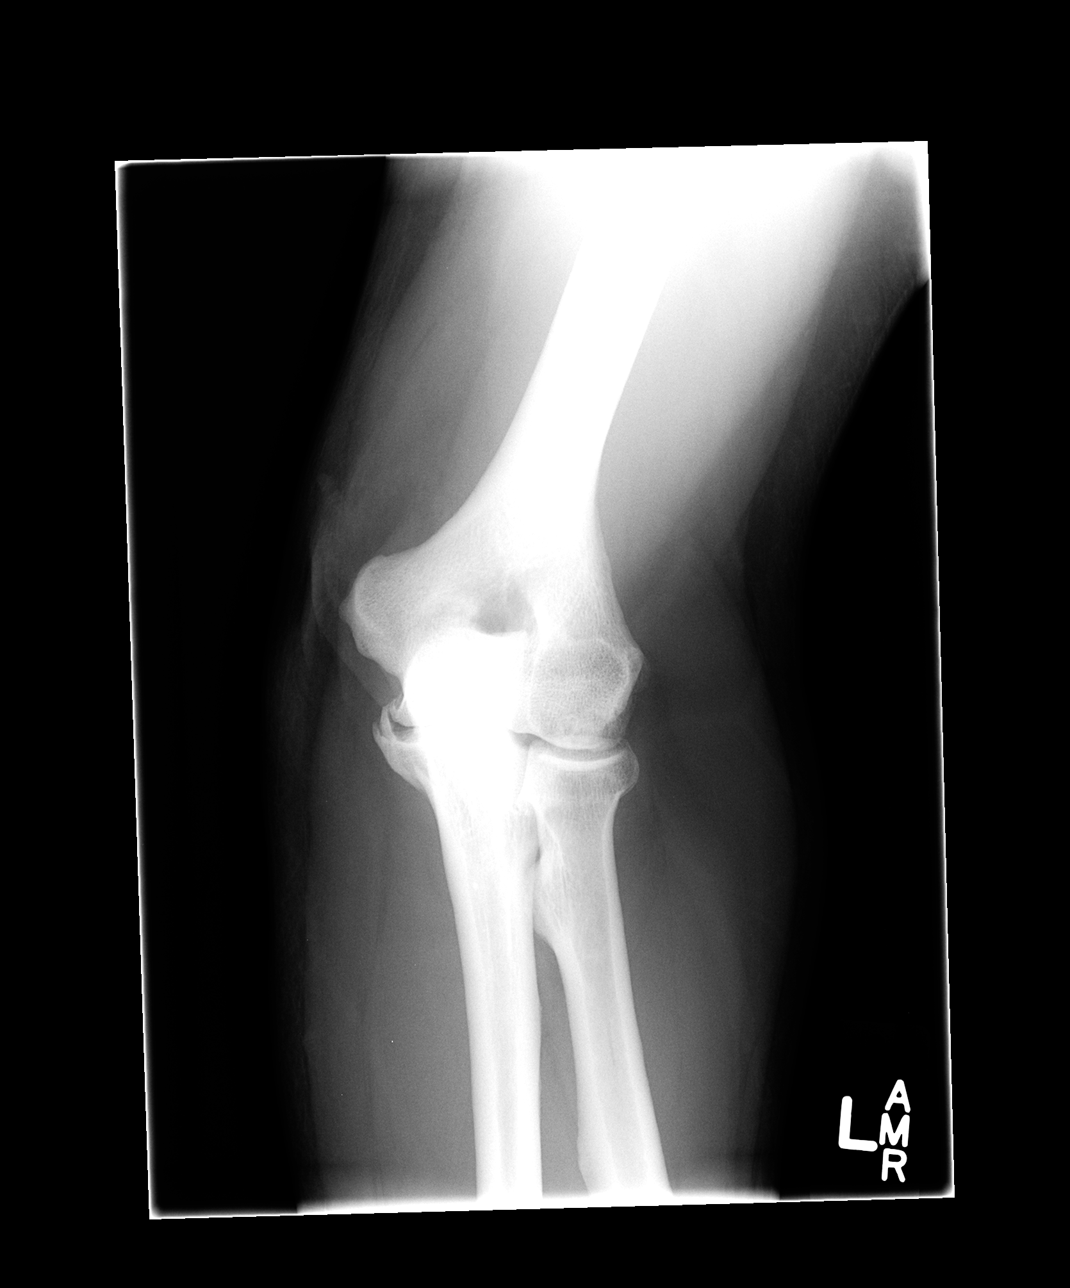

[view not recorded (4 of 4)]
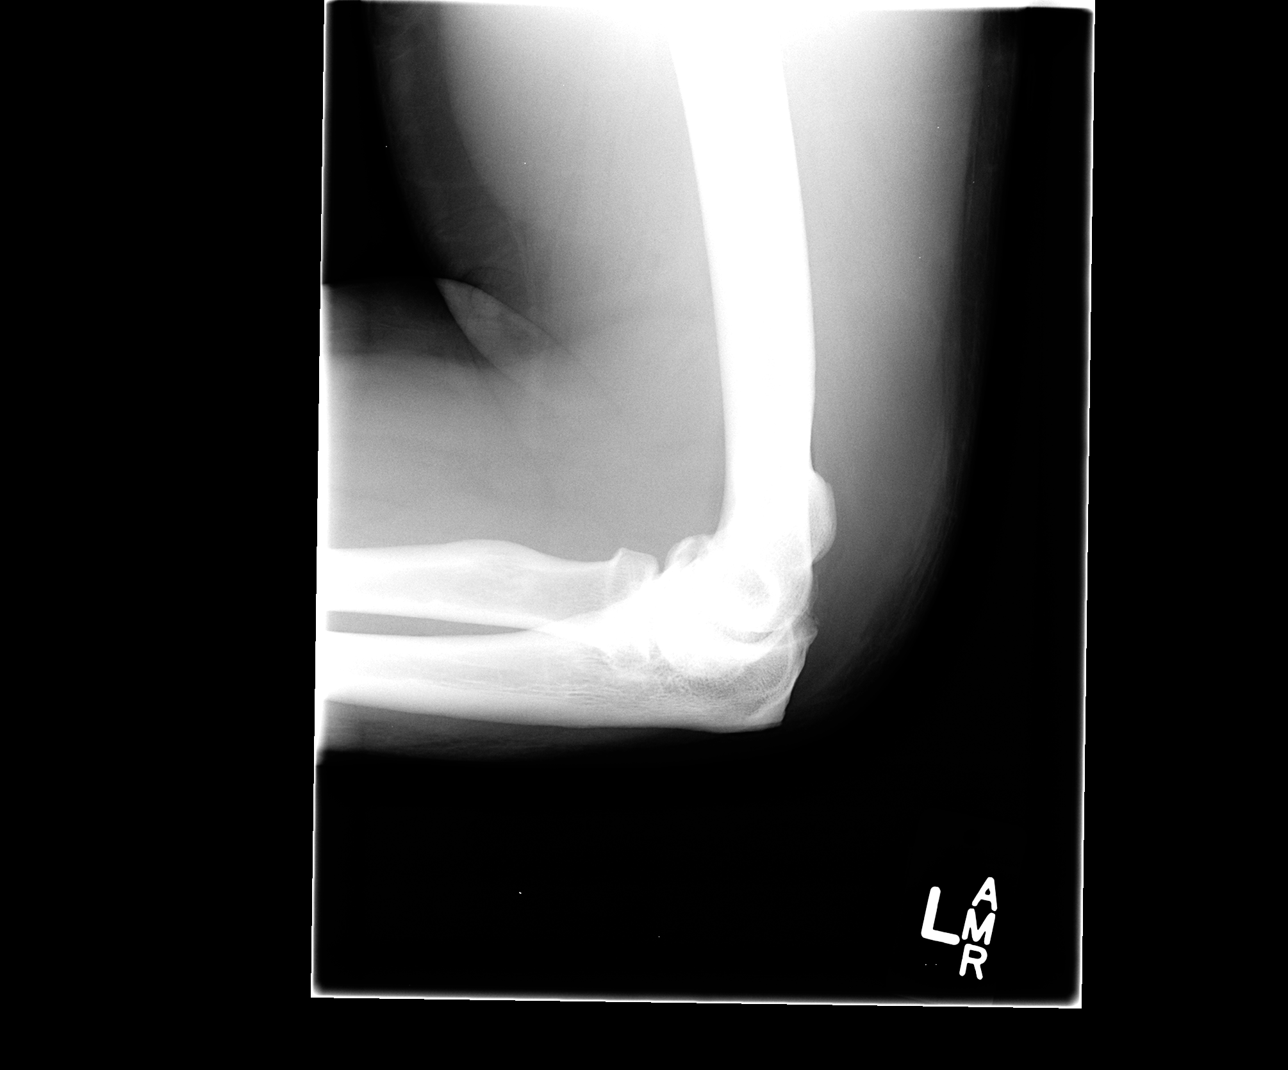

[4 of 4 positions shown; findings below may reference images not displayed]

FINDINGS: Four views of left elbow submitted. No acute fracture or
subluxation. Spurring of proximal ulna and radial head. Mild
spurring of lateral humeral epicondyle.
IMPRESSION: No acute fracture or subluxation.  Mild osteoarthritic changes.

## 2016-03-25 IMAGING — DX DG FEMUR 2+V*L*
4 series · 4 of 4 positions shown · non-contrast
Comparison: None

CLINICAL DATA: Fell 6-7 weeks ago using snow plow, hurt knee again
last night, anterior pain above patella and at distal femur when
bending LEFT knee

EXAM:
LEFT FEMUR 2 VIEWS

[femur ap (1 of 2)]
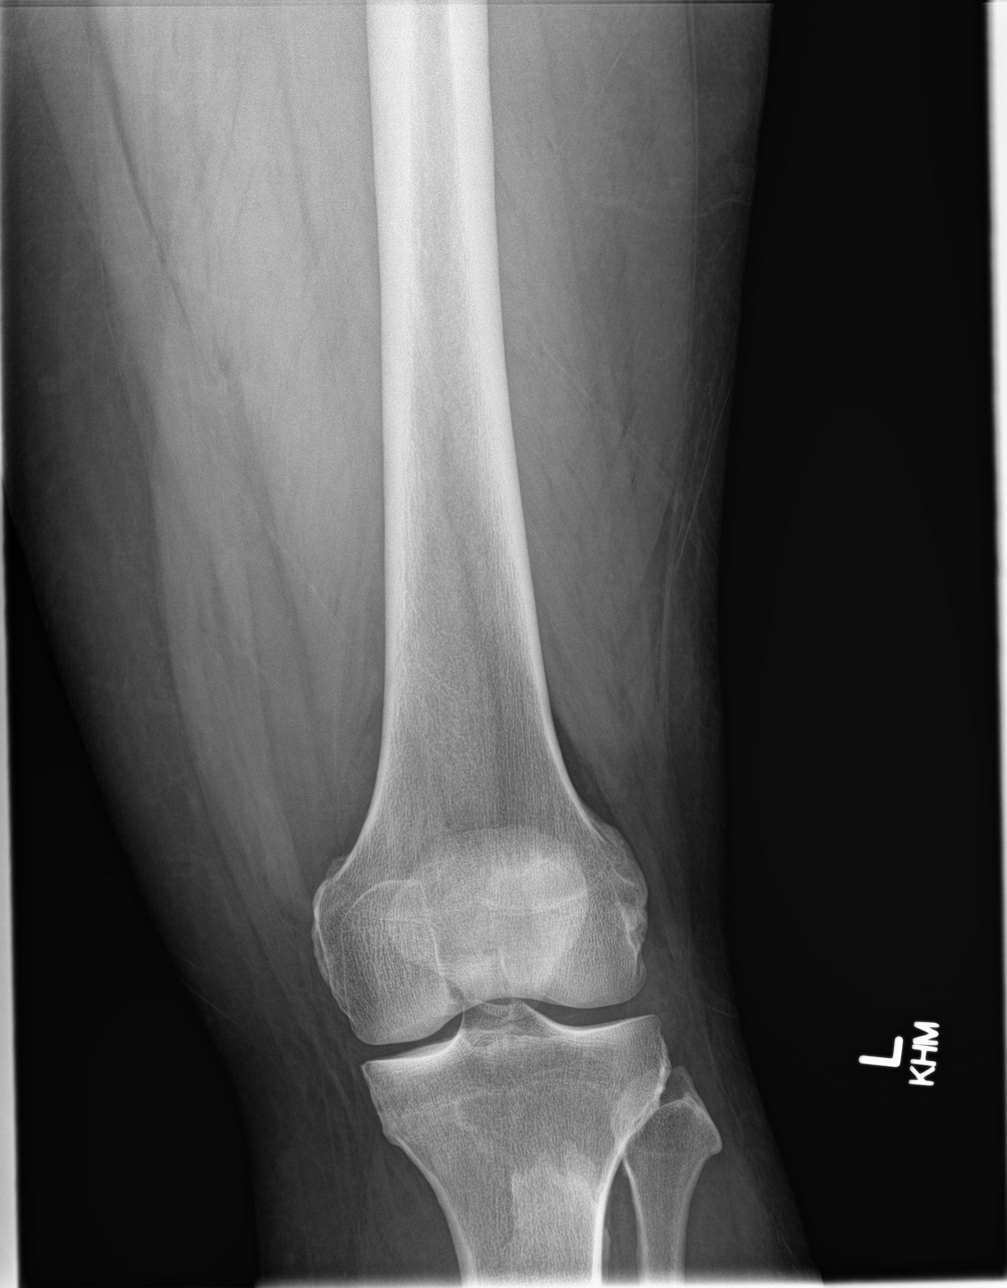

[femur lat (1 of 2)]
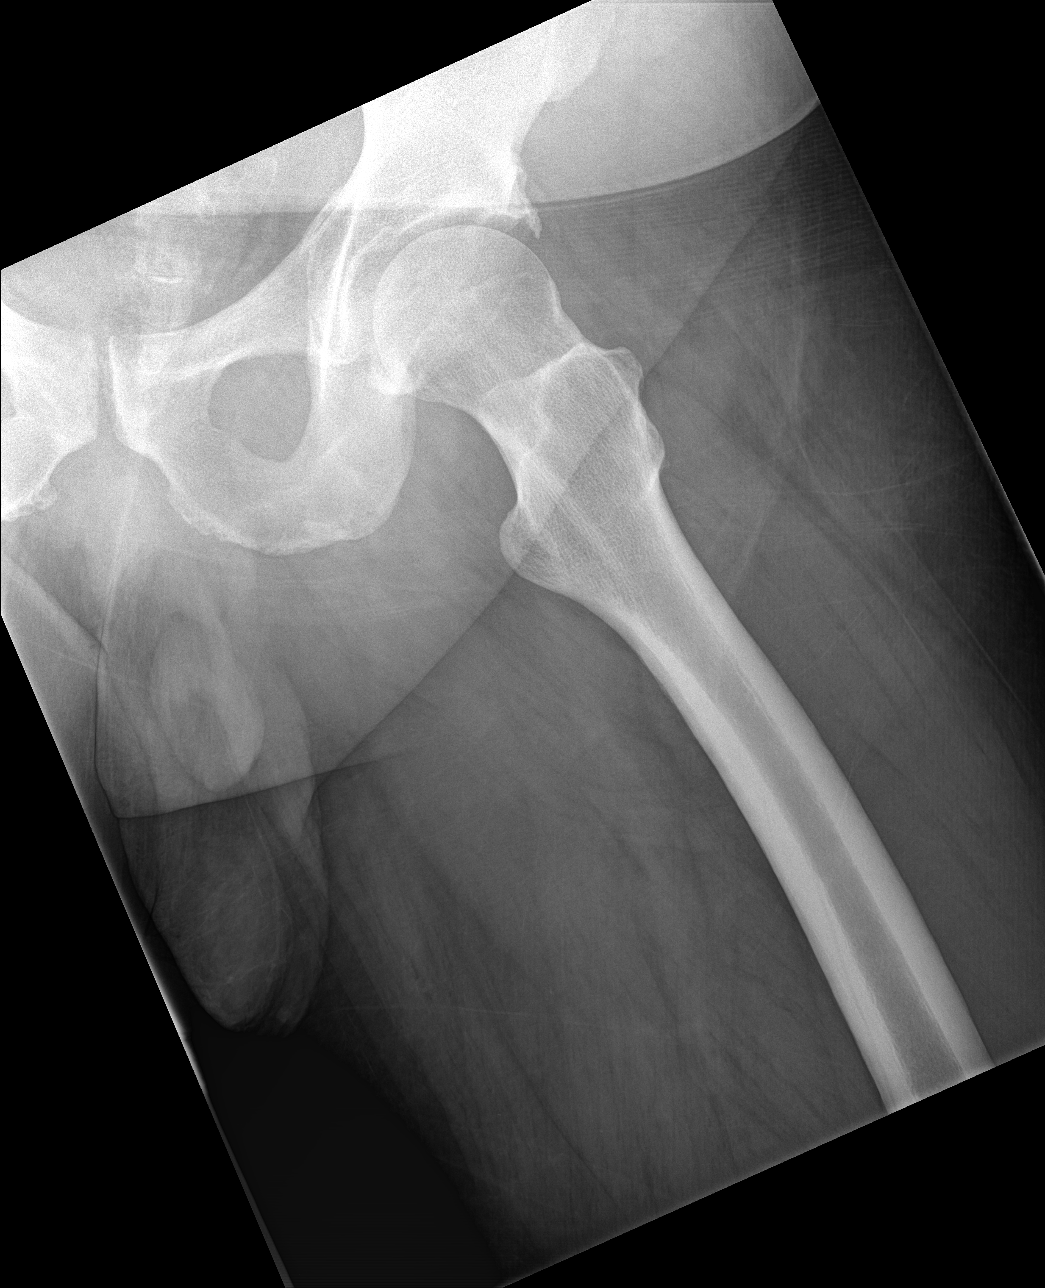

[femur lat (2 of 2)]
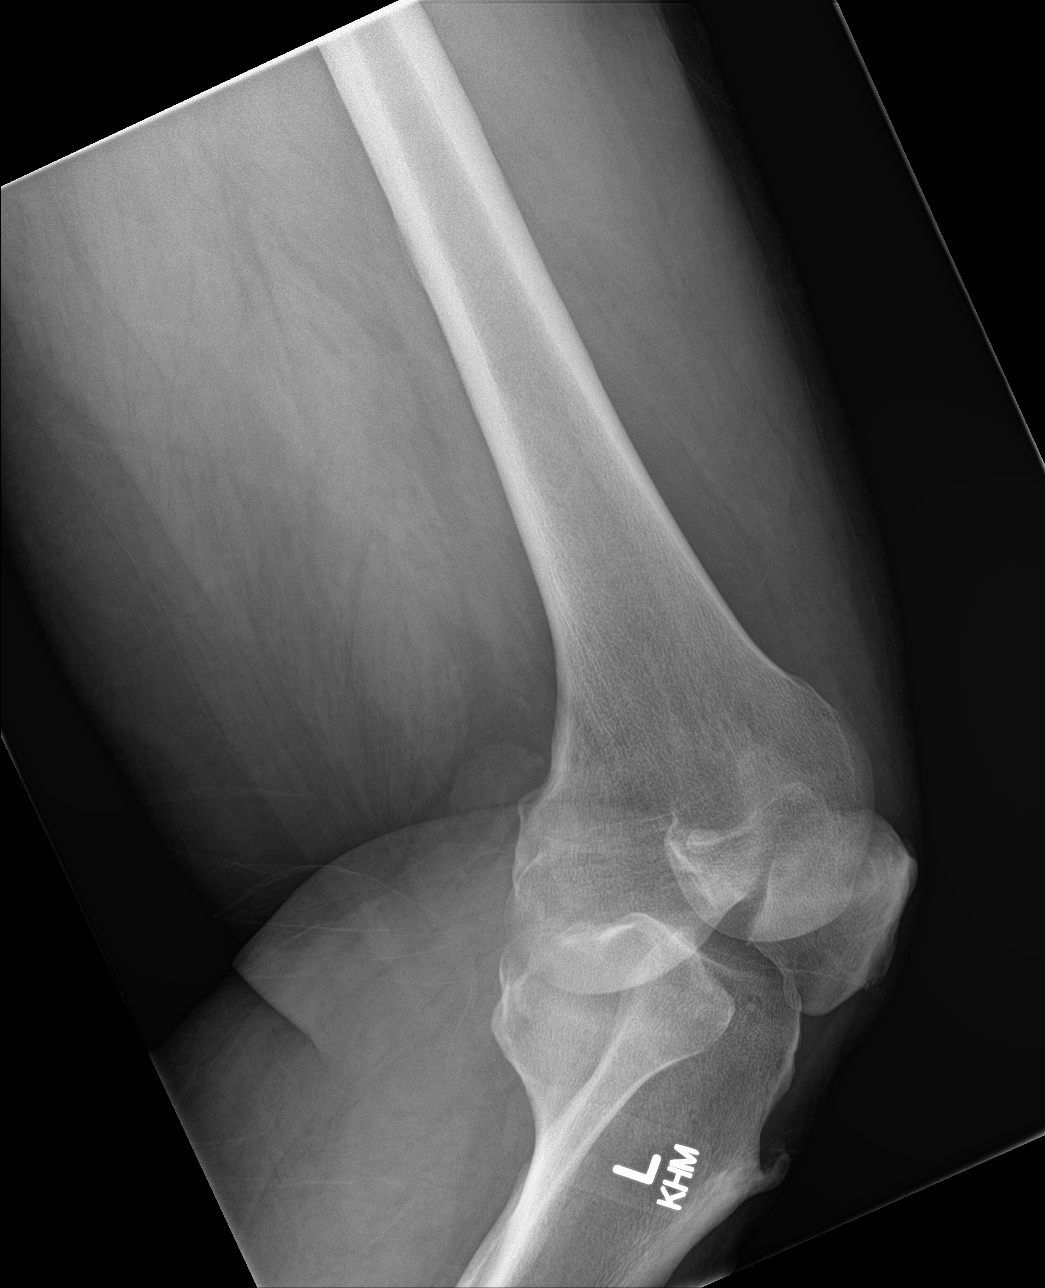

[femur ap (2 of 2)]
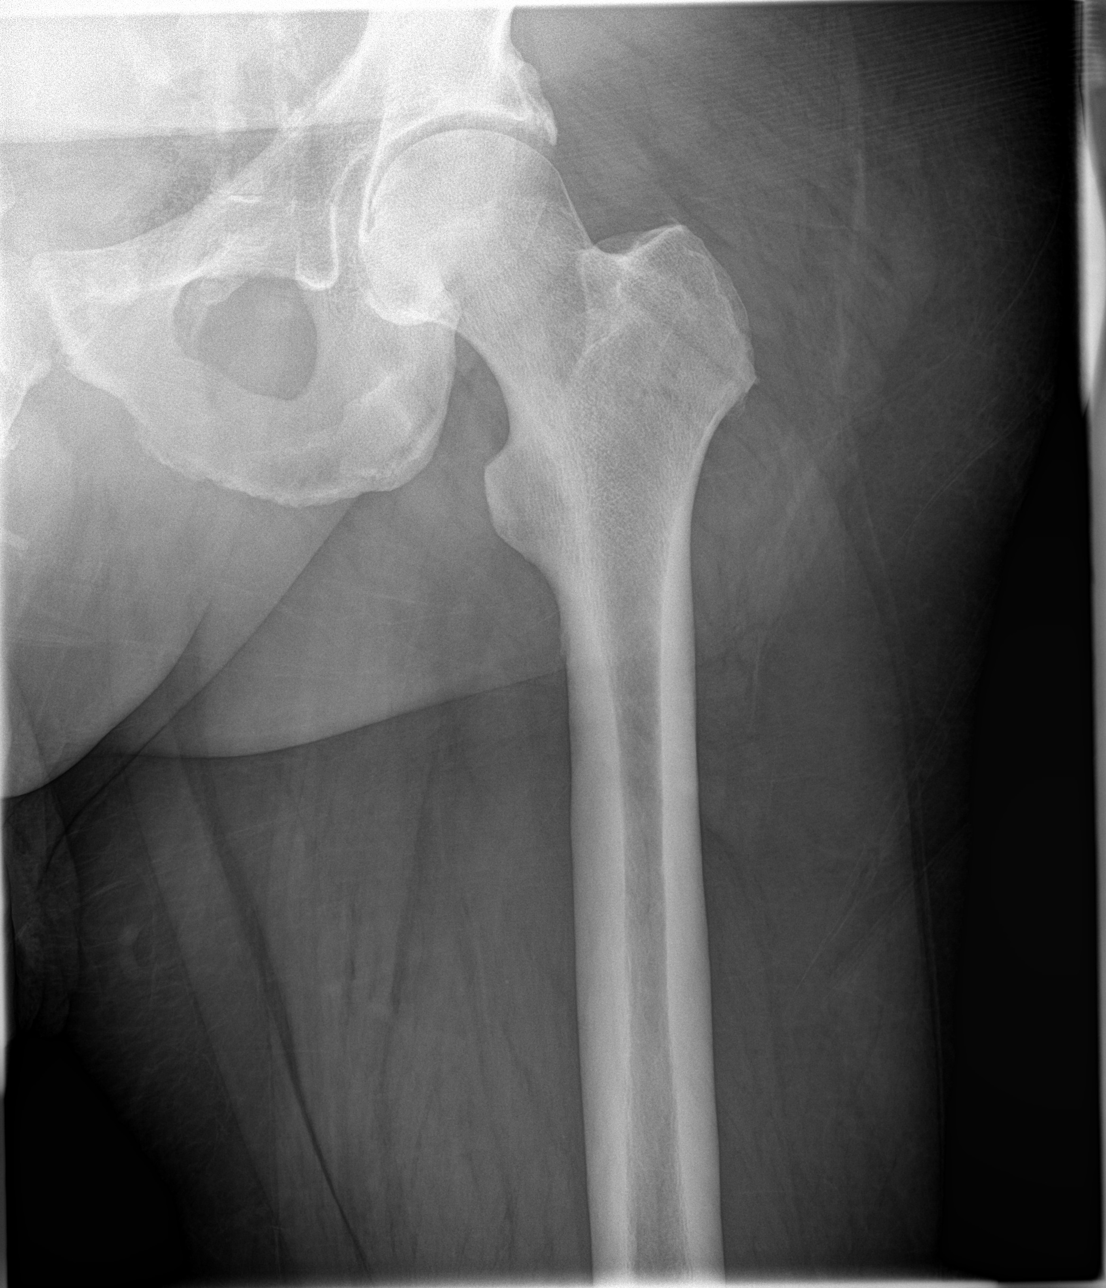

[4 of 4 positions shown; findings below may reference images not displayed]

FINDINGS: Insert normal mineral

Knee and hip joint alignments normal.

Prominent tibial tubercle, normal variant.

No acute fracture, dislocation,
IMPRESSION: Normal exam.

## 2016-08-12 ENCOUNTER — Encounter (HOSPITAL_COMMUNITY): Payer: Self-pay | Admitting: Emergency Medicine

## 2016-08-12 ENCOUNTER — Emergency Department (HOSPITAL_COMMUNITY)
Admission: EM | Admit: 2016-08-12 | Discharge: 2016-08-13 | Disposition: A | Discharge status: Home / Self Care | Payer: Medicaid Other | Attending: Emergency Medicine | Admitting: Emergency Medicine

## 2016-08-12 DIAGNOSIS — Y998 Other external cause status: Secondary | ICD-10-CM | POA: Diagnosis not present

## 2016-08-12 DIAGNOSIS — J449 Chronic obstructive pulmonary disease, unspecified: Secondary | ICD-10-CM | POA: Insufficient documentation

## 2016-08-12 DIAGNOSIS — Z79899 Other long term (current) drug therapy: Secondary | ICD-10-CM | POA: Diagnosis not present

## 2016-08-12 DIAGNOSIS — Z87891 Personal history of nicotine dependence: Secondary | ICD-10-CM | POA: Diagnosis not present

## 2016-08-12 DIAGNOSIS — F1410 Cocaine abuse, uncomplicated: Secondary | ICD-10-CM | POA: Insufficient documentation

## 2016-08-12 DIAGNOSIS — S39012A Strain of muscle, fascia and tendon of lower back, initial encounter: Secondary | ICD-10-CM | POA: Insufficient documentation

## 2016-08-12 DIAGNOSIS — I10 Essential (primary) hypertension: Secondary | ICD-10-CM | POA: Insufficient documentation

## 2016-08-12 DIAGNOSIS — Z7982 Long term (current) use of aspirin: Secondary | ICD-10-CM | POA: Insufficient documentation

## 2016-08-12 DIAGNOSIS — Y9389 Activity, other specified: Secondary | ICD-10-CM | POA: Diagnosis not present

## 2016-08-12 DIAGNOSIS — Z85818 Personal history of malignant neoplasm of other sites of lip, oral cavity, and pharynx: Secondary | ICD-10-CM | POA: Insufficient documentation

## 2016-08-12 DIAGNOSIS — I2510 Atherosclerotic heart disease of native coronary artery without angina pectoris: Secondary | ICD-10-CM | POA: Insufficient documentation

## 2016-08-12 DIAGNOSIS — W01198A Fall on same level from slipping, tripping and stumbling with subsequent striking against other object, initial encounter: Secondary | ICD-10-CM | POA: Diagnosis not present

## 2016-08-12 DIAGNOSIS — Y929 Unspecified place or not applicable: Secondary | ICD-10-CM | POA: Insufficient documentation

## 2016-08-12 DIAGNOSIS — S3992XA Unspecified injury of lower back, initial encounter: Secondary | ICD-10-CM | POA: Diagnosis present

## 2016-08-12 DIAGNOSIS — I252 Old myocardial infarction: Secondary | ICD-10-CM | POA: Insufficient documentation

## 2016-08-12 MED ORDER — CYCLOBENZAPRINE HCL 10 MG PO TABS
10.0000 mg | ORAL_TABLET | Freq: Once | ORAL | Status: AC
  Administered 2016-08-12: 10 mg via ORAL
  Filled 2016-08-12: qty 1

## 2016-08-12 MED ORDER — IBUPROFEN 600 MG PO TABS: 600.0000 mg | ORAL_TABLET | Freq: Four times a day (QID) | ORAL | 0 refills | Status: DC | PRN

## 2016-08-12 MED ORDER — IBUPROFEN 800 MG PO TABS
800.0000 mg | ORAL_TABLET | Freq: Once | ORAL | Status: AC
  Administered 2016-08-12: 800 mg via ORAL
  Filled 2016-08-12: qty 1

## 2016-08-12 MED ORDER — OXYCODONE-ACETAMINOPHEN 5-325 MG PO TABS
1.0000 | ORAL_TABLET | Freq: Once | ORAL | Status: AC
  Administered 2016-08-12: 1 via ORAL
  Filled 2016-08-12: qty 1

## 2016-08-12 MED ORDER — CYCLOBENZAPRINE HCL 10 MG PO TABS: 10.0000 mg | ORAL_TABLET | Freq: Three times a day (TID) | ORAL | 0 refills | Status: DC | PRN

## 2016-08-12 NOTE — ED Provider Notes (Signed)
[facility] DEPT Provider Note   CSN: [id number] Arrival date & time: 08/12/16  2224     History   Chief Complaint Chief Complaint  Patient presents with  . Fall    HPI Ronald Best is a 57 y.o. male.  HPI   Ronald Best is a 56 y.o. male who presents to the Emergency Department complaining of right sided low back pain for one day.  States the pain began after a mechanical fall.  He missed an outside step and fell against a pick up truck, striking his right arm.  Denies blow to his back, arm pain or swelling or other injuries, but states that he noticed pain to his right back this morning upon waking.  Pain is worse with certain movements, improves at rest and also improved somewhat after taking aleve earlier today.  He denies numbness, pain or weakness of the lower extremities, spinal tenderness, abd pain, urine or bowel changes.      Past Medical History:  Diagnosis Date  . COPD (chronic obstructive pulmonary disease) ([place])   . Coronary artery disease   . Hypertension   . Kidney stones   . MI (myocardial infarction) ([place])   . Throat cancer [facility])     Patient Active Problem List   Diagnosis Date Noted  . CAP (community acquired pneumonia) 07/11/2015  . Acute respiratory failure with hypoxia and hypercarbia ([place]) 07/11/2015  . Compensated respiratory acidosis 07/11/2015  . HYPERLIPIDEMIA 04/01/2009  . COCAINE ABUSE 04/01/2009  . ESSENTIAL HYPERTENSION, BENIGN 04/01/2009  . CAD 04/01/2009  . NONDEPENDENT TOBACCO USE DISORDER 03/31/2009  . ACUT MI SUBENDOCARDIAL INFARCT INIT EPIS CARE 03/31/2009  . CHEST PAIN UNSPECIFIED 03/31/2009    Past Surgical History:  Procedure Laterality Date  . CORONARY STENT PLACEMENT    . THROAT SURGERY         Home Medications    Prior to Admission medications   Medication Sig Start Date End Date Taking? Authorizing Provider  albuterol (PROVENTIL HFA;VENTOLIN HFA) 108 (90 Base) MCG/ACT inhaler Inhale 2 puffs into the lungs every  4 (four) hours as needed for wheezing or shortness of breath.   Yes [provider]  albuterol (PROVENTIL) (2.5 MG/3ML) 0.083% nebulizer solution Take 2.5 mg by nebulization every 6 (six) hours as needed for wheezing or shortness of breath.   Yes [provider]  aspirin 81 MG chewable tablet Chew 1 tablet (81 mg total) by mouth daily. 07/13/15  Yes Thurnell Lose, MD  colchicine 0.6 MG tablet Take 1 tablet (0.6 mg total) by mouth 2 (two) times daily. Patient taking differently: Take 0.6 mg by mouth daily as needed (gout flare up).  06/13/15  Yes Domenic Moras, PA-C  furosemide (LASIX) 20 MG tablet Take 20 mg by mouth daily. 11/14/15  Yes [provider]  hydrochlorothiazide (HYDRODIURIL) 25 MG tablet Take 25 mg by mouth daily.  03/28/15  Yes [provider]    Family History Family History  Problem Relation Age of Onset  . Diabetes Mother     Social History Social History  Substance Use Topics  . Smoking status: Former Smoker    Packs/day: 1.00    Years: 1.00    Types: Cigarettes  . Smokeless tobacco: Never Used  . Alcohol use No     Allergies   Patient has no known allergies.   Review of Systems Review of Systems  Constitutional: Negative for fever.  Eyes: Negative for visual disturbance.  Respiratory: Negative for shortness of breath.  Gastrointestinal: Negative for abdominal pain, constipation and vomiting.  Genitourinary: Negative for decreased urine volume, difficulty urinating, dysuria, flank pain and hematuria.  Musculoskeletal: Positive for back pain. Negative for arthralgias, joint swelling and neck pain.  Skin: Negative for rash.  Neurological: Negative for syncope, weakness and numbness.  All other systems reviewed and are negative.    Physical Exam Updated Vital Signs BP (!) 178/85 (BP Location: Left Arm)   Pulse 94   Temp 97.7 F (36.5 C) (Oral)   Resp 20   Ht 5\' 11"  (1.803 m)   Wt 126.1 kg (277 lb 14.4 oz)   SpO2  93%   BMI 38.76 kg/m   Physical Exam  Constitutional: He is oriented to person, place, and time. He appears well-developed and well-nourished. No distress.  HENT:  Head: Normocephalic and atraumatic.  Mouth/Throat: Oropharynx is clear and moist.  Neck: Normal range of motion. Neck supple.  Cardiovascular: Normal rate, regular rhythm, normal heart sounds and intact distal pulses.   No murmur heard. Pulmonary/Chest: Effort normal and breath sounds normal. No respiratory distress.  Abdominal: Soft. He exhibits no distension. There is no tenderness.  Musculoskeletal: He exhibits tenderness. He exhibits no edema.       Lumbar back: He exhibits tenderness and pain. He exhibits normal range of motion, no swelling, no deformity, no laceration and normal pulse.       Back:  Focal ttp of the right lower lumbar paraspinal muscles.  No spinal tenderness.   Pt has 5/5 strength against resistance of bilateral lower extremities.  FROM of the right shoulder and elbow.  NT, no edema   Neurological: He is alert and oriented to person, place, and time. He has normal strength. No sensory deficit. He exhibits normal muscle tone. Coordination and gait normal.  Reflex Scores:      Patellar reflexes are 2+ on the right side and 2+ on the left side.      Achilles reflexes are 2+ on the right side and 2+ on the left side. Skin: Skin is warm and dry. Capillary refill takes less than 2 seconds. No rash noted.  Nursing note and vitals reviewed.    ED Treatments / Results  Labs (all labs ordered are listed, but only abnormal results are displayed) Labs Reviewed - No data to display  EKG  EKG Interpretation None       Radiology No results found.  Procedures Procedures (including critical care time)  Medications Ordered in ED Medications  cyclobenzaprine (FLEXERIL) tablet 10 mg (not administered)  oxyCODONE-acetaminophen (PERCOCET/ROXICET) 5-325 MG per tablet 1 tablet (not administered)  ibuprofen  (ADVIL,MOTRIN) tablet 800 mg (not administered)     Initial Impression / Assessment and Plan / ED Course  I have reviewed the triage vital signs and the nursing notes.  Pertinent labs & imaging results that were available during my care of the patient were reviewed by me and considered in my medical decision making (see chart for details).     Pt is well appearing.  Ambulates with a steady gait.  No focal neuro deficits on exam.  No concerning sx's for emergent neurological or infectious process.  Pain likely musculoskeletal.  He agrees to ice, rest, muscle relaxer and prescription NSAID.  return precautions discussed.   Final Clinical Impressions(s) / ED Diagnoses   Final diagnoses:  Lumbar strain, initial encounter    New Prescriptions New Prescriptions   No medications on file     Kem Parkinson, [place] 08/12/16 2359  Ezequiel Essex, MD 08/13/16 [phone]

## 2016-08-12 NOTE — ED Triage Notes (Signed)
Pt tripped and fell down 2 steps yesterday.  Having right lower back pain

## 2016-08-12 NOTE — Discharge Instructions (Signed)
Apply ice packs on/off to your back.  Avoid twisting and bending movements for one week.  Return here for any worsening symptoms

## 2016-12-30 ENCOUNTER — Ambulatory Visit (INDEPENDENT_AMBULATORY_CARE_PROVIDER_SITE_OTHER): Payer: Medicaid Other | Admitting: Family Medicine

## 2016-12-30 ENCOUNTER — Encounter: Payer: Self-pay | Admitting: Family Medicine

## 2016-12-30 VITALS — BP 121/63 | HR 81 | Temp 98.1°F | Ht 71.0 in | Wt 283.0 lb

## 2016-12-30 DIAGNOSIS — Z1159 Encounter for screening for other viral diseases: Secondary | ICD-10-CM | POA: Diagnosis not present

## 2016-12-30 DIAGNOSIS — Z9989 Dependence on other enabling machines and devices: Secondary | ICD-10-CM

## 2016-12-30 DIAGNOSIS — G4733 Obstructive sleep apnea (adult) (pediatric): Secondary | ICD-10-CM | POA: Diagnosis not present

## 2016-12-30 DIAGNOSIS — J410 Simple chronic bronchitis: Secondary | ICD-10-CM

## 2016-12-30 DIAGNOSIS — I2510 Atherosclerotic heart disease of native coronary artery without angina pectoris: Secondary | ICD-10-CM | POA: Diagnosis not present

## 2016-12-30 DIAGNOSIS — J449 Chronic obstructive pulmonary disease, unspecified: Secondary | ICD-10-CM | POA: Insufficient documentation

## 2016-12-30 DIAGNOSIS — Z114 Encounter for screening for human immunodeficiency virus [HIV]: Secondary | ICD-10-CM | POA: Diagnosis not present

## 2016-12-30 DIAGNOSIS — E785 Hyperlipidemia, unspecified: Secondary | ICD-10-CM | POA: Diagnosis not present

## 2016-12-30 DIAGNOSIS — Z23 Encounter for immunization: Secondary | ICD-10-CM

## 2016-12-30 DIAGNOSIS — F1410 Cocaine abuse, uncomplicated: Secondary | ICD-10-CM

## 2016-12-30 DIAGNOSIS — I10 Essential (primary) hypertension: Secondary | ICD-10-CM

## 2016-12-30 MED ORDER — ALLOPURINOL 100 MG PO TABS: 100.0000 mg | ORAL_TABLET | Freq: Every day | ORAL | 2 refills | Status: DC

## 2016-12-30 NOTE — Progress Notes (Signed)
BP 121/63   Pulse 81   Temp 98.1 F (36.7 C) (Oral)   Ht _0  (1.803 m)   Wt 283 lb (128.4 kg)   BMI 39.47 kg/m    Subjective:    Patient ID: Ronald Best, male    DOB: August 20, 1960, 56 y.o.   MRN: [id number]  HPI: Ronald Best is a 56 y.o. male presenting on 12/30/2016 for Establish Care   HPI Hypertension Patient is currently on lisinopril, he was on hydrochlorothiazide but wants to stop because he feels like it is causing him to feel lightheaded and dizzy, and their blood pressure today is 121/63. Patient denies any lightheadedness or dizziness. Patient denies headaches, blurred vision, chest pains, shortness of breath, or weakness. Denies any side effects from medication and is content with current medication.   Hyperlipidemia with CAD and history of stent Patient is coming in for recheck of his hyperlipidemia and has a history of CAD with a stent. The patient is currently taking aspirin 81 mg and has refused a statin due to intolerance in the past and wants to wait to recheck the labs to see if one is needed.. They deny any issues with myalgias or history of liver damage from it. They deny any focal numbness or weakness or chest pain.  Sleep apnea Patient has a history of sleep apnea or has been diagnosed with sleep apnea and currently uses a machine about 7-8 hours per night and says that it is doing well for him.  He says the settings are doing well and he sleeps much better with it than without it and so he uses it every night.  COPD Patient has been diagnosed with COPD.  He says that he is not currently having any issues with that and only uses albuterol nebulized as needed and has not needed it very frequently if at all over the past few months.  He denies any coughing spells or wheezing or shortness of breath.  Patient has history of cocaine abuse  Relevant past medical, surgical, family and social history reviewed and updated as indicated. Interim medical history since our  last visit reviewed. Allergies and medications reviewed and updated.  Review of Systems  Constitutional: Negative for chills and fever.  HENT: Negative for congestion, ear pain and tinnitus.   Eyes: Negative for pain and discharge.  Respiratory: Negative for cough, shortness of breath and wheezing.   Cardiovascular: Negative for chest pain, palpitations and leg swelling.  Gastrointestinal: Negative for abdominal pain, blood in stool, constipation and diarrhea.  Genitourinary: Negative for hematuria.  Musculoskeletal: Negative for back pain, gait problem and myalgias.  Skin: Negative for rash.  Neurological: Negative for dizziness, weakness and headaches.  Psychiatric/Behavioral: Negative for suicidal ideas.  All other systems reviewed and are negative.   Per HPI unless specifically indicated above  Social History   Social History  . Marital status: Single    Spouse name: N/A  . Number of children: N/A  . Years of education: N/A   Occupational History  . Not on file.   Social History Main Topics  . Smoking status: Former Smoker    Packs/day: 3.00    Years: 30.00    Types: Cigarettes    Quit date: 03/01/2013  . Smokeless tobacco: Never Used  . Alcohol use Yes     Comment: occasional holidays, 2-3 mixed, used to be heavy drinker  . Drug use: No     Comment: history of cocaine, 2 months  . Sexual  activity: Yes    Birth control/ protection: Condom     Comment: girl, condoms every time   Other Topics Concern  . Not on file   Social History Narrative  . No narrative on file    Past Surgical History:  Procedure Laterality Date  . CORONARY STENT PLACEMENT    . THROAT SURGERY      Family History  Problem Relation Age of Onset  . Diabetes Mother   . Heart disease Father        heart attack  . Kidney disease Father   . Heart disease Sister        10 passed from bypass, cad    Allergies as of 12/30/2016   No Known Allergies     Medication List         Accurate as of 12/30/16  9:52 AM. Always use your most recent med list.          allopurinol 100 MG tablet Commonly known as:  ZYLOPRIM Take 1 tablet (100 mg total) by mouth daily.   aspirin EC 81 MG tablet Take 81 mg by mouth daily.   colchicine 0.6 MG tablet Take 1 tablet (0.6 mg total) by mouth 2 (two) times daily.   furosemide 20 MG tablet Commonly known as:  LASIX Take 20 mg by mouth daily.   lisinopril 20 MG tablet Commonly known as:  PRINIVIL,ZESTRIL Take 20 mg by mouth daily.          Objective:    BP 121/63   Pulse 81   Temp 98.1 F (36.7 C) (Oral)   Ht _0  (1.803 m)   Wt 283 lb (128.4 kg)   BMI 39.47 kg/m   Wt Readings from Last 3 Encounters:  12/30/16 283 lb (128.4 kg)  08/12/16 277 lb 14.4 oz (126.1 kg)  07/11/15 (!) 310 lb (140.6 kg)    Physical Exam  Constitutional: He is oriented to person, place, and time. He appears well-developed and well-nourished. No distress.  HENT:  Right Ear: External ear normal.  Left Ear: External ear normal.  Nose: Nose normal.  Mouth/Throat: Oropharynx is clear and moist. No oropharyngeal exudate.  Eyes: Conjunctivae and EOM are normal. Pupils are equal, round, and reactive to light. Right eye exhibits no discharge. No scleral icterus.  Neck: Neck supple. No thyromegaly present.  Cardiovascular: Normal rate, regular rhythm, normal heart sounds and intact distal pulses.  No murmur heard. Pulmonary/Chest: Effort normal and breath sounds normal. No respiratory distress. He has no wheezes.  Abdominal: Soft. Bowel sounds are normal. He exhibits no distension. There is no tenderness. There is no rebound and no guarding.  Musculoskeletal: Normal range of motion. He exhibits no edema.  Lymphadenopathy:    He has no cervical adenopathy.  Neurological: He is alert and oriented to person, place, and time. Coordination normal.  Skin: Skin is warm and dry. No rash noted. He is not diaphoretic.  Psychiatric: He has a normal  mood and affect. His behavior is normal.  Nursing note and vitals reviewed.    Assessment & Plan:   Problem List Items Addressed This Visit      Cardiovascular and Mediastinum   ESSENTIAL HYPERTENSION, BENIGN - Primary   Relevant Medications   aspirin EC 81 MG tablet   lisinopril (PRINIVIL,ZESTRIL) 20 MG tablet   Other Relevant Orders   CMP14+EGFR   Ambulatory referral to Cardiology   CAD (coronary artery disease)   Relevant Medications   aspirin EC 81 MG  tablet   lisinopril (PRINIVIL,ZESTRIL) 20 MG tablet   Other Relevant Orders   Lipid panel   Ambulatory referral to Cardiology     Respiratory   OSA on CPAP   Relevant Orders   CBC with Differential/Platelet   COPD (chronic obstructive pulmonary disease) (HCC)     Other   Hyperlipidemia LDL goal <130   Relevant Medications   aspirin EC 81 MG tablet   lisinopril (PRINIVIL,ZESTRIL) 20 MG tablet   Cocaine abuse ([place])    Other Visit Diagnoses    Screening for HIV without presence of risk factors       Relevant Orders   HIV antibody   Need for hepatitis C screening test       Relevant Orders   Hepatitis C antibody      We will stop hydrochlorothiazide and continue the rest of his current medications and have him keep an eye on it and recheck in 6 months.  Follow up plan: Return in about 6 months (around 06/29/2017), or if symptoms worsen or fail to improve, for htn, hld, copd.  Caryl Pina, MD [facility] 12/30/2016, 9:52 AM

## 2016-12-31 LAB — CMP14+EGFR
A/G RATIO: 1.4 (ref 1.2–2.2)
ALBUMIN: 4.4 g/dL (ref 3.5–5.5)
ALT: 10 IU/L (ref 0–44)
AST: 16 IU/L (ref 0–40)
Alkaline Phosphatase: 62 IU/L (ref 39–117)
BILIRUBIN TOTAL: 0.3 mg/dL (ref 0.0–1.2)
BUN/Creatinine Ratio: 12 (ref 9–20)
BUN: 15 mg/dL (ref 6–24)
CALCIUM: 9.6 mg/dL (ref 8.7–10.2)
CHLORIDE: 96 mmol/L (ref 96–106)
CO2: 31 mmol/L — ABNORMAL HIGH (ref 20–29)
Creatinine, Ser: 1.22 mg/dL (ref 0.76–1.27)
GFR, EST AFRICAN AMERICAN: 76 mL/min/{1.73_m2} (ref >59)
GFR, EST NON AFRICAN AMERICAN: 65 mL/min/{1.73_m2} (ref >59)
Globulin, Total: 3.2 g/dL (ref 1.5–4.5)
Glucose: 77 mg/dL (ref 65–99)
POTASSIUM: 4.3 mmol/L (ref 3.5–5.2)
Sodium: 143 mmol/L (ref 134–144)
TOTAL PROTEIN: 7.6 g/dL (ref 6.0–8.5)

## 2016-12-31 LAB — HIV ANTIBODY (ROUTINE TESTING W REFLEX): HIV Screen 4th Generation wRfx: NONREACTIVE

## 2016-12-31 LAB — CBC WITH DIFFERENTIAL/PLATELET
BASOS ABS: 0.1 10*3/uL (ref 0.0–0.2)
BASOS: 1 %
EOS (ABSOLUTE): 0.3 10*3/uL (ref 0.0–0.4)
Eos: 4 %
HEMOGLOBIN: 13.2 g/dL (ref 13.0–17.7)
Hematocrit: 41 % (ref 37.5–51.0)
IMMATURE GRANS (ABS): 0 10*3/uL (ref 0.0–0.1)
Immature Granulocytes: 0 %
LYMPHS ABS: 1.6 10*3/uL (ref 0.7–3.1)
Lymphs: 20 %
MCH: 30.1 pg (ref 26.6–33.0)
MCHC: 32.2 g/dL (ref 31.5–35.7)
MCV: 93 fL (ref 79–97)
Monocytes Absolute: 0.7 10*3/uL (ref 0.1–0.9)
Monocytes: 9 %
NEUTROS ABS: 5.1 10*3/uL (ref 1.4–7.0)
Neutrophils: 66 %
Platelets: 288 10*3/uL (ref 150–379)
RBC: 4.39 x10E6/uL (ref 4.14–5.80)
RDW: 15.4 % (ref 12.3–15.4)
WBC: 7.8 10*3/uL (ref 3.4–10.8)

## 2016-12-31 LAB — LIPID PANEL
CHOL/HDL RATIO: 3.8 ratio (ref 0.0–5.0)
Cholesterol, Total: 165 mg/dL (ref 100–199)
HDL: 44 mg/dL (ref >39)
LDL Calculated: 93 mg/dL (ref 0–99)
TRIGLYCERIDES: 140 mg/dL (ref 0–149)
VLDL Cholesterol Cal: 28 mg/dL (ref 5–40)

## 2016-12-31 LAB — HEPATITIS C ANTIBODY: Hep C Virus Ab: <0.1 s/co ratio (ref 0.0–0.9)

## 2017-01-26 NOTE — Progress Notes (Signed)
Cardiology Office Note   Date:  01/27/2017   ID:  Ronald Best, DOB Jul 08, 1960, MRN [id number]  PCP:  Dettinger, Fransisca Kaufmann, MD  Cardiologist:   Minus Breeding, MD  Referring:  Dettinger, Fransisca Kaufmann, MD  Chief Complaint  Patient presents with  . Coronary Artery Disease      History of Present Illness: Ronald Best is a 56 y.o. male who is referred by Dettinger, Fransisca Kaufmann, MD for evaluation of CAD.  He had 2011 non-ST myocardial infarction.  Cardiac catheterization demonstrated 30% mid LAD stenosis, second OM had 50% stenosis.  The right coronary artery was occluded in the mid vessel.  EF was 35-40%.  He had a DES to the RCA.   He did have a follow-up echo after that the demonstrated normal EF.   He has not been seen in several years.    He is limited in his activities.  He can do some ambulation although is not particularly active.  He denies any of the acute shortness of breath it was his previous angina.  He also thinks he had some left arm discomfort at that time and he does not have this.  He does not describe any chest pressure, neck or arm discomfort.  He is not describing any palpitations, presyncope or syncope.  He has no PND or orthopnea.  He does have some chronic lung disease and shortness of breath related to this.  Past Medical History:  Diagnosis Date  . COPD (chronic obstructive pulmonary disease) ([place])   . Coronary artery disease   . Hyperlipidemia   . Hypertension   . Kidney stones   . MI (myocardial infarction) ([place])   . OSA (obstructive sleep apnea)    CPAP  . Throat cancer ([place])    throat    Past Surgical History:  Procedure Laterality Date  . CORONARY STENT PLACEMENT    . THROAT SURGERY       Current Outpatient Medications  Medication Sig Dispense Refill  . allopurinol (ZYLOPRIM) 100 MG tablet Take 1 tablet (100 mg total) by mouth daily. 30 tablet 2  . aspirin EC 81 MG tablet Take 81 mg by mouth daily.    . furosemide (LASIX) 20 MG tablet Take 20 mg by  mouth daily.    Marland Kitchen lisinopril (PRINIVIL,ZESTRIL) 20 MG tablet Take 20 mg by mouth daily.    Marland Kitchen ezetimibe (ZETIA) 10 MG tablet Take 1 tablet (10 mg total) by mouth daily. 30 tablet 11   No current facility-administered medications for this visit.     Allergies:   Patient has no known allergies.    Social History:  The patient  reports that he quit smoking about 3 years ago. His smoking use included cigarettes. He has a 90.00 pack-year smoking history. he has never used smokeless tobacco. He reports that he drinks alcohol. He reports that he does not use drugs.   Family History:  The patient's family history includes Diabetes in his mother; Heart disease in his sister; Heart disease (age of onset: 41) in his father; Kidney disease in his father.    ROS:  Please see the history of present illness.   Otherwise, review of systems are positive for back pains, pain in the bottoms of his feet.   All other systems are reviewed and negative.    PHYSICAL EXAM: VS:  BP 118/62   Pulse 73   Ht 5\' 11"  (1.803 m)   Wt 293 lb (132.9 kg)   SpO2 95%  BMI 40.87 kg/m  , BMI Body mass index is 40.87 kg/m. GENERAL:  Well appearing HEENT:  Pupils equal round and reactive, fundi not visualized, oral mucosa unremarkable NECK:  No jugular venous distention, waveform within normal limits, carotid upstroke brisk and symmetric, no bruits, no thyromegaly LYMPHATICS:  No cervical, inguinal adenopathy LUNGS:  Clear to auscultation bilaterally BACK:  No CVA tenderness CHEST:  Unremarkable HEART:  PMI not displaced or sustained,S1 and S2 within normal limits, no S3, no S4, no clicks, no rubs, no murmurs ABD:  Flat, positive bowel sounds normal in frequency in pitch, no bruits, no rebound, no guarding, no midline pulsatile mass, no hepatomegaly, no splenomegaly, umbilical hernia EXT:  2 plus pulses throughout, no edema, no cyanosis no clubbing SKIN:  No rashes no nodules NEURO:  Cranial nerves II through XII grossly  intact, motor grossly intact throughout PSYCH:  Cognitively intact, oriented to person place and time    EKG:  EKG is ordered today. The ekg ordered today demonstrates sinus rhythm, rate 71, axis within normal limits, intervals within normal limits, no acute ST-T wave changes.   Recent Labs: 12/30/2016: ALT 10; BUN 15; Creatinine, Ser 1.22; Hemoglobin 13.2; Platelets 288; Potassium 4.3; Sodium 143    Lipid Panel    Component Value Date/Time   CHOL 165 12/30/2016 1014   TRIG 140 12/30/2016 1014   HDL 44 12/30/2016 1014   CHOLHDL 3.8 12/30/2016 1014   CHOLHDL 5.8 03/12/2009 0355   VLDL 31 03/12/2009 0355   LDLCALC 93 12/30/2016 1014      Wt Readings from Last 3 Encounters:  01/27/17 293 lb (132.9 kg)  12/30/16 283 lb (128.4 kg)  08/12/16 277 lb 14.4 oz (126.1 kg)      Other studies Reviewed: Additional studies/ records that were reviewed today include: Labs. Review of the above records demonstrates:  Please see elsewhere in the note.     ASSESSMENT AND PLAN:  CAD:    I will bring the patient back for a POET (Plain Old Exercise Test). This will allow me to screen for obstructive coronary disease, risk stratify and very importantly provide a prescription for exercise.  HTN:  The blood pressure is at target. No change in medications is indicated. We will continue with therapeutic lifestyle changes (TLC).  DYSLIPIDEMIA: He has been intolerant to statins.  However, his LDL goal should be less than 70 and so I am going to start Zetia 10 mg daily.  Current medicines are reviewed at length with the patient today.  The patient does not have concerns regarding medicines.  The following changes have been made:  no change  Labs/ tests ordered today include:   Orders Placed This Encounter  Procedures  . EXERCISE TOLERANCE TEST (ETT)  . EKG 12-Lead     Disposition:   FU with me in a couple of years pending the results of the stress test.     Signed, Minus Breeding, MD    01/27/2017 12:44 PM    [place]

## 2017-01-27 ENCOUNTER — Ambulatory Visit: Payer: Medicaid Other | Admitting: Cardiology

## 2017-01-27 ENCOUNTER — Encounter: Payer: Self-pay | Admitting: *Deleted

## 2017-01-27 ENCOUNTER — Encounter: Payer: Self-pay | Admitting: Cardiology

## 2017-01-27 VITALS — BP 118/62 | HR 73 | Ht 71.0 in | Wt 293.0 lb

## 2017-01-27 DIAGNOSIS — I10 Essential (primary) hypertension: Secondary | ICD-10-CM | POA: Diagnosis not present

## 2017-01-27 DIAGNOSIS — E785 Hyperlipidemia, unspecified: Secondary | ICD-10-CM | POA: Diagnosis not present

## 2017-01-27 DIAGNOSIS — I2510 Atherosclerotic heart disease of native coronary artery without angina pectoris: Secondary | ICD-10-CM

## 2017-01-27 MED ORDER — EZETIMIBE 10 MG PO TABS: 10.0000 mg | ORAL_TABLET | Freq: Every day | ORAL | 11 refills | Status: DC

## 2017-01-27 NOTE — Patient Instructions (Signed)
Medication Instructions:   Begin Zetia 10mg  daily.  Continue all other medications.    Labwork: none  Testing/Procedures:  Your physician has requested that you have an exercise tolerance test. For further information please visit [email or web address]. Please also follow instruction sheet, as given.  Office will contact with results via phone or letter.    Follow-Up: Your physician wants you to follow up in:  2 years.  You will receive a reminder letter in the mail one-two months in advance.  If you don't receive a letter, please call our office to schedule the follow up appointment   Any Other Special Instructions Will Be Listed Below (If Applicable).  If you need a refill on your cardiac medications before your next appointment, please call your pharmacy.

## 2017-02-04 ENCOUNTER — Ambulatory Visit (HOSPITAL_COMMUNITY)
Admission: RE | Admit: 2017-02-04 | Discharge: 2017-02-04 | Disposition: A | Discharge status: Home / Self Care | Payer: Medicaid Other | Source: Ambulatory Visit | Attending: Cardiology | Admitting: Cardiology

## 2017-02-04 ENCOUNTER — Encounter (HOSPITAL_COMMUNITY): Payer: Medicaid Other

## 2017-02-04 DIAGNOSIS — I2510 Atherosclerotic heart disease of native coronary artery without angina pectoris: Secondary | ICD-10-CM | POA: Insufficient documentation

## 2017-02-04 LAB — EXERCISE TOLERANCE TEST
CHL CUP MPHR: 163 {beats}/min
CHL RATE OF PERCEIVED EXERTION: 17
CSEPEDS: 3 s
CSEPEW: 7 METS
Exercise duration (min): 5 min
Peak HR: 126 {beats}/min
Percent HR: 77 %
Rest HR: 71 {beats}/min

## 2017-02-21 ENCOUNTER — Encounter: Payer: Self-pay | Admitting: *Deleted

## 2017-02-21 ENCOUNTER — Telehealth: Payer: Self-pay | Admitting: Cardiology

## 2017-02-21 NOTE — Telephone Encounter (Signed)
Ronald Best is returning a call . Thanks

## 2017-02-24 NOTE — Telephone Encounter (Signed)
Letter with result mail to pt

## 2017-03-31 ENCOUNTER — Encounter: Payer: Self-pay | Admitting: *Deleted

## 2017-07-01 ENCOUNTER — Encounter: Payer: Self-pay | Admitting: Family Medicine

## 2017-07-01 ENCOUNTER — Ambulatory Visit: Payer: Medicaid Other | Admitting: Family Medicine

## 2017-07-01 VITALS — BP 126/58 | HR 97 | Temp 97.4°F | Ht 71.0 in | Wt 274.0 lb

## 2017-07-01 DIAGNOSIS — E785 Hyperlipidemia, unspecified: Secondary | ICD-10-CM | POA: Diagnosis not present

## 2017-07-01 DIAGNOSIS — I10 Essential (primary) hypertension: Secondary | ICD-10-CM | POA: Diagnosis not present

## 2017-07-01 DIAGNOSIS — Z9989 Dependence on other enabling machines and devices: Secondary | ICD-10-CM

## 2017-07-01 DIAGNOSIS — G4733 Obstructive sleep apnea (adult) (pediatric): Secondary | ICD-10-CM | POA: Diagnosis not present

## 2017-07-01 DIAGNOSIS — J410 Simple chronic bronchitis: Secondary | ICD-10-CM

## 2017-07-01 NOTE — Progress Notes (Signed)
BP (!) 126/58   Pulse 97   Temp (!) 97.4 F (36.3 C) (Oral)   Ht 5' 11"  (1.803 m)   Wt 274 lb (124.3 kg)   BMI 38.22 kg/m    Subjective:    Patient ID: Ronald Best, male    DOB: 11/19/1960, 57 y.o.   MRN: [id number]  HPI: Ronald Best is a 57 y.o. male presenting on 07/01/2017 for Hypertension ([place]); COPD; and Sleep Apnea   HPI Hypertension Patient is currently on lisinopril and sees cardiology for, and their blood pressure today is 126/58. Patient denies any lightheadedness or dizziness. Patient denies headaches, blurred vision, chest pains, shortness of breath, or weakness. Denies any side effects from medication and is content with current medication.   Morbid obesity and sleep apnea  patient is coming in to discuss morbid obesity and sleep apnea as well. He says he needs a new sleep machine and wants a re-referral to there.  He has not seen anybody for a sleep study or sleep test in quite some time.  He is trying to lose weight and trying to get to be more active here in the recent past.  COPD Patient is coming in for COPD recheck today.  He is currently on no medication because he has been intolerant of some inhalers in the past, based on history it sounds like he was intolerant of albuterol because it made his heart race..  He has a mild chronic cough but denies any major coughing spells or wheezing spells.  He has 0nighttime symptoms per week and 1-2daytime symptoms per week currently.   Hyperlipidemia Patient is coming in for recheck of his hyperlipidemia. The patient is currently taking Zetia. They deny any issues with myalgias or history of liver damage from it. They deny any focal numbness or weakness or chest pain.   Relevant past medical, surgical, family and social history reviewed and updated as indicated. Interim medical history since our last visit reviewed. Allergies and medications reviewed and updated.  Review of Systems  Constitutional: Negative for  chills and fever.  HENT: Negative for congestion.   Respiratory: Positive for cough and wheezing. Negative for shortness of breath.   Cardiovascular: Negative for chest pain, palpitations and leg swelling.  Musculoskeletal: Negative for back pain and gait problem.  Skin: Negative for rash.  Neurological: Negative for dizziness, weakness and light-headedness.  All other systems reviewed and are negative.   Per HPI unless specifically indicated above   Allergies as of 07/01/2017   No Known Allergies     Medication List        Accurate as of 07/01/17  3:51 PM. Always use your most recent med list.          allopurinol 100 MG tablet Commonly known as:  ZYLOPRIM Take 1 tablet (100 mg total) by mouth daily.   aspirin EC 81 MG tablet Take 81 mg by mouth daily.   ezetimibe 10 MG tablet Commonly known as:  ZETIA Take 1 tablet (10 mg total) by mouth daily.   furosemide 20 MG tablet Commonly known as:  LASIX Take 20 mg by mouth daily.   lisinopril 20 MG tablet Commonly known as:  PRINIVIL,ZESTRIL Take 20 mg by mouth daily.          Objective:    BP (!) 126/58   Pulse 97   Temp (!) 97.4 F (36.3 C) (Oral)   Ht 5' 11"  (1.803 m)   Wt 274 lb (124.3  kg)   BMI 38.22 kg/m   Wt Readings from Last 3 Encounters:  07/01/17 274 lb (124.3 kg)  01/27/17 293 lb (132.9 kg)  12/30/16 283 lb (128.4 kg)    Physical Exam  Constitutional: He is oriented to person, place, and time. He appears well-developed and well-nourished. No distress.  Eyes: Conjunctivae are normal. Right eye exhibits no discharge. No scleral icterus.  Neck: Neck supple. No thyromegaly present.  Cardiovascular: Normal rate, regular rhythm, normal heart sounds and intact distal pulses.  No murmur heard. Pulmonary/Chest: Effort normal and breath sounds normal. Stridor (Stridor which is chronic because of throat cancer) present. No respiratory distress. He has no wheezes.  Musculoskeletal: Normal range of motion.  He exhibits no edema.  Lymphadenopathy:    He has no cervical adenopathy.  Neurological: He is alert and oriented to person, place, and time. Coordination normal.  Skin: Skin is warm and dry. No rash noted. He is not diaphoretic.  Psychiatric: He has a normal mood and affect. His behavior is normal.  Nursing note and vitals reviewed.       Assessment & Plan:   Problem List Items Addressed This Visit      Cardiovascular and Mediastinum   ESSENTIAL HYPERTENSION, BENIGN - Primary   Relevant Orders   CMP14+EGFR     Respiratory   OSA on CPAP   Relevant Orders   CBC with Differential/Platelet   Ambulatory referral to Sleep Studies   COPD (chronic obstructive pulmonary disease) ([place])     Other   Hyperlipidemia LDL goal <130   Relevant Orders   Lipid panel   Morbid obesity ([place])    BMI of 38 with comorbidities of hypertension and cholesterol and sleep apnea         Patient says he wants to wait until next time to do labs and does not want to do them today.  Patient says he needs a new machine for sleep apnea because his is having some troubles and falling apart.  He says it has been very long since he had a sleep study so we will refer him out to get one done.  Discussed obesity, wanted to check cholesterol and kidneys today but patient did not want to.  Continue current medications.  Follow up plan: Return in about 6 months (around 01/01/2018), or if symptoms worsen or fail to improve, for Recheck hypertension and cholesterol and sleep apnea and COPD.  Counseling provided for all of the vaccine components Orders Placed This Encounter  Procedures  . CBC with Differential/Platelet  . CMP14+EGFR  . Lipid panel    Caryl Pina, MD [facility] 07/01/2017, 3:51 PM

## 2017-07-01 NOTE — Assessment & Plan Note (Signed)
BMI of 38 with comorbidities of hypertension and cholesterol and sleep apnea

## 2017-08-18 ENCOUNTER — Institutional Professional Consult (permissible substitution): Payer: Medicaid Other | Admitting: Neurology

## 2017-10-17 ENCOUNTER — Encounter: Payer: Self-pay | Admitting: Neurology

## 2017-10-17 ENCOUNTER — Ambulatory Visit: Payer: Medicaid Other | Admitting: Neurology

## 2017-10-17 VITALS — BP 150/89 | HR 100 | Ht 71.0 in | Wt 282.0 lb

## 2017-10-17 DIAGNOSIS — I252 Old myocardial infarction: Secondary | ICD-10-CM | POA: Diagnosis not present

## 2017-10-17 DIAGNOSIS — Z85819 Personal history of malignant neoplasm of unspecified site of lip, oral cavity, and pharynx: Secondary | ICD-10-CM

## 2017-10-17 DIAGNOSIS — E669 Obesity, unspecified: Secondary | ICD-10-CM

## 2017-10-17 DIAGNOSIS — G4733 Obstructive sleep apnea (adult) (pediatric): Secondary | ICD-10-CM

## 2017-10-17 DIAGNOSIS — J449 Chronic obstructive pulmonary disease, unspecified: Secondary | ICD-10-CM

## 2017-10-17 DIAGNOSIS — Z9989 Dependence on other enabling machines and devices: Secondary | ICD-10-CM

## 2017-10-17 NOTE — Patient Instructions (Signed)

## 2017-10-17 NOTE — Progress Notes (Signed)
Subjective:    Patient ID: Ronald Best is a 57 y.o. male.  HPI     Ronald Age, MD, PhD [facility] [address] [place], [place] [address]  Dear Dr. Warrick Parisian,   I saw your patient, Ronald Best, upon your kind request in my neurologic clinic today for initial consultation of his sleep disorder, in particular, concern for underlying obstructive sleep apnea. The patient is unaccompanied today. As you know, Ronald Best is a 57 year old right-handed gentleman with an underlying medical history of coronary artery disease with status post MI, COPD, hypertension, hyperlipidemia, kidney stones, throat cancer, gout and obesity, who was previously diagnosed with obstructive sleep apnea and placed on CPAP therapy. Prior sleep study results are not available for my review today. He believes, his sleep study was over 10 years ago. He has an older CPAP machine, and a CPAP compliance download is not possible from his machine. I reviewed your office note from 07/01/2017. He has no current DME company. He reports that he is compliant with treatment and in fact cannot sleep without his CPAP. His Epworth sleepiness score is 0 out of 24, fatigue score is 9 out of 63. He lives with his 33 yo daughter (who is his only child) and her 79 yo son. He is divorced. He currently does not work. He quit smoking 4 years ago. Quit drinking alcohol and quit using cocaine. Uses a [facility] for his CPAP. His sister has OSA too.   His Past Medical History Is Significant For: Past Medical History:  Diagnosis Date  . COPD (chronic obstructive pulmonary disease) ([place])   . Coronary artery disease   . Hyperlipidemia   . Hypertension   . Kidney stones   . MI (myocardial infarction) ([place])   . OSA (obstructive sleep apnea)    CPAP  . Throat cancer ([place])    throat    His Past Surgical History Is Significant For: Past Surgical History:  Procedure Laterality Date  . CORONARY STENT PLACEMENT     . THROAT SURGERY      His Family History Is Significant For: Family History  Problem Relation Best of Onset  . Diabetes Mother   . Heart disease Father 59       heart attack  . Kidney disease Father   . Heart disease Sister        CABG Best 70 or so.      His Social History Is Significant For: Social History   Socioeconomic History  . Marital status: Single    Spouse name: Not on file  . Number of children: Not on file  . Years of education: Not on file  . Highest education level: Not on file  Occupational History  . Not on file  Social Needs  . Financial resource strain: Not on file  . Food insecurity:    Worry: Not on file    Inability: Not on file  . Transportation needs:    Medical: Not on file    Non-medical: Not on file  Tobacco Use  . Smoking status: Former Smoker    Packs/day: 3.00    Years: 30.00    Pack years: 90.00    Types: Cigarettes    Last attempt to quit: 03/01/2013    Years since quitting: 4.6  . Smokeless tobacco: Never Used  Substance and Sexual Activity  . Alcohol use: Yes    Comment: occasional holidays, 2-3 mixed, used to be heavy drinker  .  Drug use: No    Comment: history of cocaine, 2 months  . Sexual activity: Yes    Birth control/protection: Condom    Comment: girl, condoms every time  Lifestyle  . Physical activity:    Days per week: Not on file    Minutes per session: Not on file  . Stress: Not on file  Relationships  . Social connections:    Talks on phone: Not on file    Gets together: Not on file    Attends religious service: Not on file    Active member of club or organization: Not on file    Attends meetings of clubs or organizations: Not on file    Relationship status: Not on file  Other Topics Concern  . Not on file  Social History Narrative   Lives with daughter.  Divorced.  Disabled.      His Allergies Are:  No Known Allergies:   His Current Medications Are:  Outpatient Encounter Medications as of 10/17/2017   Medication Sig  . allopurinol (ZYLOPRIM) 100 MG tablet Take 1 tablet (100 mg total) by mouth daily.  Marland Kitchen aspirin EC 81 MG tablet Take 81 mg by mouth daily.  . furosemide (LASIX) 20 MG tablet Take 20 mg by mouth daily.  Marland Kitchen lisinopril (PRINIVIL,ZESTRIL) 20 MG tablet Take 20 mg by mouth daily.  Marland Kitchen ezetimibe (ZETIA) 10 MG tablet Take 1 tablet (10 mg total) by mouth daily.   No facility-administered encounter medications on file as of 10/17/2017.   :  Review of Systems:  Out of a complete 14 point review of systems, all are reviewed and negative with the exception of these symptoms as listed below: Review of Systems  Neurological:       Pt presents today to discuss his sleep. Pt needs a new cpap. Pt can't sleep without his current cpap. Pt had a sleep study many years ago. Pt's current cpap is not able to be downloaded by this office. Pt does not have a current DME.  Epworth Sleepiness Scale 0= would never doze 1= slight chance of dozing 2= moderate chance of dozing 3= high chance of dozing  Sitting and reading: 0 Watching TV: 0 Sitting inactive in a public place (ex. Theater or meeting): 0 As a passenger in a car for an hour without a break: 0 Lying down to rest in the afternoon: 0 Sitting and talking to someone: 0 Sitting quietly after lunch (no alcohol): 0 In a car, while stopped in traffic: 0 Total: 0     Objective:  Neurological Exam  Physical Exam Physical Examination:   Vitals:   10/17/17 1532  BP: (!) 150/89  Pulse: 100    General Examination: The patient is a very pleasant 57 y.o. male in no acute distress. He appears well-developed and well-nourished and well groomed.   HEENT: Normocephalic, atraumatic, pupils are equal, round and reactive to light and accommodation. Extraocular tracking is good without limitation to gaze excursion or nystagmus noted. Normal smooth pursuit is noted. Hearing is grossly intact. Face is symmetric with normal facial animation and normal  facial sensation. Speech is raspy/hoarse, not new. Has anterior neck scar from cancer surgery. There is no lip, neck/head, jaw or voice tremor. Neck is supple with full range of passive and active motion. There are no carotid bruits on auscultation. Oropharynx exam reveals: mild mouth dryness, adequate dental hygiene and moderate airway crowding, due to larger uvula and tonsils in place of about 1-2+ bilaterally. Mallampati is class  III. Neck circumference is 20-3/4 inches. He has a mild overbite.  Chest: Clear to auscultation without wheezing, rhonchi or crackles noted.  Heart: S1+S2+0, regular and normal without murmurs, rubs or gallops noted.   Abdomen: Soft, non-tender and non-distended with normal bowel sounds appreciated on auscultation.  Extremities: There is 1+ pitting edema in the distal lower extremities bilaterally, R more than L.   Skin: Warm and dry without trophic changes noted.   Musculoskeletal: exam reveals no obvious joint deformities, tenderness or joint swelling or erythema.   Neurologically:  Mental status: The patient is awake, alert and oriented in all 4 spheres. His immediate and remote memory, attention, language skills and fund of knowledge are appropriate. There is no evidence of aphasia, agnosia, apraxia or anomia. Speech is clear with normal prosody and enunciation. Thought process is linear. Mood is normal and affect is normal.  Cranial nerves II - XII are as described above under HEENT exam. In addition: shoulder shrug is normal with equal shoulder height noted. Motor exam: Normal bulk, strength and tone is noted. There is no drift, tremor or rebound. Romberg is negative. Fine motor skills and coordination: grossly intact.  Cerebellar testing: No dysmetria or intention tremor. There is no truncal or gait ataxia.  Sensory exam: intact to light touch in the upper and lower extremities.  Gait, station and balance: He stands easily. No veering to one side is noted. No  leaning to one side is noted. Posture is Best-appropriate and stance is narrow based. Gait shows normal stride length and normal pace. No problems turning are noted. Tandem walk is unremarkable.   Assessment and Plan:    In summary, Ronald Best is a very pleasant 57 y.o.-year old male  with an underlying medical history of coronary artery disease with status post MI, COPD, hypertension, hyperlipidemia, kidney stones, throat cancer, gout and obesity, who was previously diagnosed with obstructive sleep apnea and placed on CPAP therapy. He requires reevaluation and new equipment. Of note, his current CPAP machine only works when he plugs it in and out, the power button no longer works. He has been using a fullface mask.  I had a long chat with the patient about my findings and the diagnosis of OSA, its prognosis and treatment options. We talked about medical treatments, surgical interventions and non-pharmacological approaches. I explained in particular the risks and ramifications of untreated moderate to severe OSA, especially with respect to developing cardiovascular disease down the Road, including congestive heart failure, difficult to treat hypertension, cardiac arrhythmias, or stroke. Even type 2 diabetes has, in part, been linked to untreated OSA. Symptoms of untreated OSA include daytime sleepiness, memory problems, mood irritability and mood disorder such as depression and anxiety, lack of energy, as well as recurrent headaches, especially morning headaches. We talked about trying to maintain a healthy lifestyle in general, as well as the importance of weight control. I encouraged the patient to eat healthy, exercise daily and keep well hydrated, to keep a scheduled bedtime and wake time routine, to not skip any meals and eat healthy snacks in between meals. I advised the patient not to drive when feeling sleepy. I recommended the following at this time: sleep study with potential positive airway pressure  titration. (We will score hypopneas at 4%).   I explained the sleep test procedure to the patient and also outlined possible surgical and non-surgical treatment options of OSA, including the use of a custom-made dental device (which would require a referral to a specialist  dentist or oral surgeon), upper airway surgical options, such as pillar implants, radiofrequency surgery, tongue base surgery, and UPPP (which would involve a referral to an ENT surgeon). Rarely, jaw surgery such as mandibular advancement may be considered.  I also explained the CPAP treatment option to the patient, who indicated that he would be willing to continue with CPAP therapy. I explained the importance of being compliant with PAP treatment, not only for insurance purposes but primarily to improve His symptoms, and for the patient's long term health benefit, including to reduce His cardiovascular risks. I answered all his questions today and the patient was in agreement. I would like to see him back after the sleep study is completed and encouraged him to call with any interim questions, concerns, problems or updates.   Thank you very much for allowing me to participate in the care of this nice patient. If I can be of any further assistance to you please do not hesitate to call me at [phone].  Sincerely,   Ronald Age, MD, PhD

## 2017-10-24 ENCOUNTER — Other Ambulatory Visit: Payer: Self-pay

## 2017-10-24 ENCOUNTER — Telehealth: Payer: Self-pay | Admitting: Family Medicine

## 2017-10-24 MED ORDER — ALLOPURINOL 100 MG PO TABS: 100.0000 mg | ORAL_TABLET | Freq: Every day | ORAL | 0 refills | Status: AC

## 2017-10-24 MED ORDER — FUROSEMIDE 20 MG PO TABS: 20.0000 mg | ORAL_TABLET | Freq: Every day | ORAL | 0 refills | Status: AC

## 2017-10-24 MED ORDER — LISINOPRIL 20 MG PO TABS: 20.0000 mg | ORAL_TABLET | Freq: Every day | ORAL | 0 refills | Status: AC

## 2017-10-24 MED ORDER — EZETIMIBE 10 MG PO TABS: 10.0000 mg | ORAL_TABLET | Freq: Every day | ORAL | 0 refills | Status: AC

## 2017-10-30 DEATH — deceased
